# Patient Record
Sex: Female | Born: 1970 | Race: White | Hispanic: No | Marital: Single | Smoking: Never smoker
Health system: Southern US, Community
[De-identification: ages and names within clinical notes are randomized; demographics above are authoritative.]

## PROBLEM LIST (undated history)

## (undated) DIAGNOSIS — M7121 Synovial cyst of popliteal space [Baker], right knee: Secondary | ICD-10-CM

## (undated) DIAGNOSIS — M1990 Unspecified osteoarthritis, unspecified site: Secondary | ICD-10-CM

## (undated) DIAGNOSIS — F419 Anxiety disorder, unspecified: Secondary | ICD-10-CM

## (undated) DIAGNOSIS — G4700 Insomnia, unspecified: Secondary | ICD-10-CM

## (undated) DIAGNOSIS — J45909 Unspecified asthma, uncomplicated: Secondary | ICD-10-CM

## (undated) DIAGNOSIS — T753XXA Motion sickness, initial encounter: Secondary | ICD-10-CM

## (undated) HISTORY — PX: TUBAL LIGATION: SHX77

## (undated) HISTORY — PX: CARPAL TUNNEL RELEASE: SHX101

## (undated) HISTORY — PX: ANTERIOR CRUCIATE LIGAMENT REPAIR: SHX115

## (undated) HISTORY — DX: Synovial cyst of popliteal space (Baker), right knee: M71.21

## (undated) HISTORY — DX: Anxiety disorder, unspecified: F41.9

## (undated) HISTORY — PX: OTHER SURGICAL HISTORY: SHX169

---

## 2013-02-03 ENCOUNTER — Other Ambulatory Visit: Payer: Self-pay

## 2013-02-03 DIAGNOSIS — Z1231 Encounter for screening mammogram for malignant neoplasm of breast: Secondary | ICD-10-CM

## 2013-03-04 ENCOUNTER — Ambulatory Visit
Admission: RE | Admit: 2013-03-04 | Discharge: 2013-03-04 | Disposition: A | Discharge status: Home / Self Care | Payer: 59 | Source: Ambulatory Visit

## 2013-03-04 DIAGNOSIS — Z1231 Encounter for screening mammogram for malignant neoplasm of breast: Secondary | ICD-10-CM

## 2014-02-23 ENCOUNTER — Other Ambulatory Visit: Payer: Self-pay

## 2014-02-23 DIAGNOSIS — Z1231 Encounter for screening mammogram for malignant neoplasm of breast: Secondary | ICD-10-CM

## 2014-03-11 ENCOUNTER — Ambulatory Visit: Payer: 59

## 2014-05-13 ENCOUNTER — Ambulatory Visit
Admission: RE | Admit: 2014-05-13 | Discharge: 2014-05-13 | Disposition: A | Discharge status: Home / Self Care | Payer: 59 | Source: Ambulatory Visit

## 2014-05-13 ENCOUNTER — Other Ambulatory Visit: Payer: Self-pay

## 2014-05-13 DIAGNOSIS — Z1231 Encounter for screening mammogram for malignant neoplasm of breast: Secondary | ICD-10-CM

## 2015-04-05 ENCOUNTER — Other Ambulatory Visit: Payer: Self-pay

## 2015-04-05 DIAGNOSIS — Z1231 Encounter for screening mammogram for malignant neoplasm of breast: Secondary | ICD-10-CM

## 2015-04-29 ENCOUNTER — Ambulatory Visit
Admission: RE | Admit: 2015-04-29 | Discharge: 2015-04-29 | Disposition: A | Discharge status: Home / Self Care | Payer: PRIVATE HEALTH INSURANCE | Source: Ambulatory Visit

## 2015-04-29 DIAGNOSIS — Z1231 Encounter for screening mammogram for malignant neoplasm of breast: Secondary | ICD-10-CM

## 2017-04-20 ENCOUNTER — Other Ambulatory Visit: Payer: Self-pay | Admitting: Family Medicine

## 2017-04-20 DIAGNOSIS — Z1231 Encounter for screening mammogram for malignant neoplasm of breast: Secondary | ICD-10-CM

## 2017-05-09 ENCOUNTER — Ambulatory Visit
Admission: RE | Admit: 2017-05-09 | Discharge: 2017-05-09 | Disposition: A | Discharge status: Home / Self Care | Payer: PRIVATE HEALTH INSURANCE | Source: Ambulatory Visit | Attending: Family Medicine | Admitting: Family Medicine

## 2017-05-09 DIAGNOSIS — Z1231 Encounter for screening mammogram for malignant neoplasm of breast: Secondary | ICD-10-CM

## 2017-05-10 ENCOUNTER — Other Ambulatory Visit: Payer: Self-pay | Admitting: Family Medicine

## 2017-05-10 DIAGNOSIS — R928 Other abnormal and inconclusive findings on diagnostic imaging of breast: Secondary | ICD-10-CM

## 2017-05-15 ENCOUNTER — Ambulatory Visit: Payer: BLUE CROSS/BLUE SHIELD

## 2017-05-15 ENCOUNTER — Ambulatory Visit
Admission: RE | Admit: 2017-05-15 | Discharge: 2017-05-15 | Disposition: A | Discharge status: Home / Self Care | Payer: BLUE CROSS/BLUE SHIELD | Source: Ambulatory Visit | Attending: Family Medicine | Admitting: Family Medicine

## 2017-05-15 DIAGNOSIS — R928 Other abnormal and inconclusive findings on diagnostic imaging of breast: Secondary | ICD-10-CM

## 2017-06-26 DIAGNOSIS — D1801 Hemangioma of skin and subcutaneous tissue: Secondary | ICD-10-CM | POA: Diagnosis not present

## 2017-06-26 DIAGNOSIS — L920 Granuloma annulare: Secondary | ICD-10-CM | POA: Diagnosis not present

## 2017-06-26 DIAGNOSIS — D485 Neoplasm of uncertain behavior of skin: Secondary | ICD-10-CM | POA: Diagnosis not present

## 2017-06-26 DIAGNOSIS — L578 Other skin changes due to chronic exposure to nonionizing radiation: Secondary | ICD-10-CM | POA: Diagnosis not present

## 2017-06-26 DIAGNOSIS — Z85828 Personal history of other malignant neoplasm of skin: Secondary | ICD-10-CM | POA: Diagnosis not present

## 2017-12-12 DIAGNOSIS — Z0000 Encounter for general adult medical examination without abnormal findings: Secondary | ICD-10-CM | POA: Diagnosis not present

## 2017-12-12 DIAGNOSIS — F411 Generalized anxiety disorder: Secondary | ICD-10-CM | POA: Diagnosis not present

## 2017-12-12 DIAGNOSIS — F5101 Primary insomnia: Secondary | ICD-10-CM | POA: Diagnosis not present

## 2018-01-25 ENCOUNTER — Ambulatory Visit (INDEPENDENT_AMBULATORY_CARE_PROVIDER_SITE_OTHER): Payer: No Typology Code available for payment source

## 2018-01-25 ENCOUNTER — Encounter: Payer: Self-pay | Admitting: Sports Medicine

## 2018-01-25 ENCOUNTER — Ambulatory Visit (INDEPENDENT_AMBULATORY_CARE_PROVIDER_SITE_OTHER): Payer: No Typology Code available for payment source | Admitting: Sports Medicine

## 2018-01-25 DIAGNOSIS — M7121 Synovial cyst of popliteal space [Baker], right knee: Secondary | ICD-10-CM

## 2018-01-25 DIAGNOSIS — Z9889 Other specified postprocedural states: Secondary | ICD-10-CM

## 2018-01-25 DIAGNOSIS — M1711 Unilateral primary osteoarthritis, right knee: Secondary | ICD-10-CM | POA: Insufficient documentation

## 2018-01-25 DIAGNOSIS — M2341 Loose body in knee, right knee: Secondary | ICD-10-CM

## 2018-01-25 DIAGNOSIS — M25561 Pain in right knee: Secondary | ICD-10-CM

## 2018-01-25 DIAGNOSIS — G8929 Other chronic pain: Secondary | ICD-10-CM | POA: Diagnosis not present

## 2018-01-25 DIAGNOSIS — M25461 Effusion, right knee: Secondary | ICD-10-CM

## 2018-01-25 HISTORY — DX: Synovial cyst of popliteal space (Baker), right knee: M71.21

## 2018-01-25 NOTE — Progress Notes (Signed)
Subjective:    I'm seeing this patient as a consultation for: [place], PA-C  CC: Right knee pain  HPI: This is a pleasant 47 year old female, she has a history of an ACL reconstruction decades ago.  Since then she has had progressive swelling and discomfort along the medial joint line of her right knee, and more recently a palpable bulge that is tender on the posterior medial aspect of her right knee as well.  Her boyfriend is a Education officer, community, and they suspected she had a Baker's cyst.  Times are moderate, persistent, localized without radiation.  I reviewed the past medical history, family history, social history, surgical history, and allergies today and no changes were needed.  Please see the problem list section below in epic for further details.  Past Medical History: Past Medical History:  Diagnosis Date  . Anxiety    Past Surgical History: No past surgical history on file. Social History: Social History   Socioeconomic History  . Marital status: Married    Spouse name: Not on file  . Number of children: Not on file  . Years of education: Not on file  . Highest education level: Not on file  Occupational History  . Not on file  Social Needs  . Financial resource strain: Not on file  . Food insecurity:    Worry: Not on file    Inability: Not on file  . Transportation needs:    Medical: Not on file    Non-medical: Not on file  Tobacco Use  . Smoking status: Never Smoker  . Smokeless tobacco: Never Used  Substance and Sexual Activity  . Alcohol use: Yes  . Drug use: Never  . Sexual activity: Yes  Lifestyle  . Physical activity:    Days per week: Not on file    Minutes per session: Not on file  . Stress: Not on file  Relationships  . Social connections:    Talks on phone: Not on file    Gets together: Not on file    Attends religious service: Not on file    Active member of club or organization: Not on file    Attends meetings of clubs or  organizations: Not on file    Relationship status: Not on file  Other Topics Concern  . Not on file  Social History Narrative  . Not on file   Family History: Family History  Problem Relation Age of Onset  . Breast cancer Neg Hx    Allergies: Allergies  Allergen Reactions  . Penicillins Nausea Only   Medications: See med rec.  Review of Systems: No headache, visual changes, nausea, vomiting, diarrhea, constipation, dizziness, abdominal pain, skin rash, fevers, chills, night sweats, weight loss, swollen lymph nodes, body aches, joint swelling, muscle aches, chest pain, shortness of breath, mood changes, visual or auditory hallucinations.   Objective:   General: Well Developed, well nourished, and in no acute distress.  Neuro:  Extra-ocular muscles intact, able to move all 4 extremities, sensation grossly intact.  Deep tendon reflexes tested were normal. Psych: Alert and oriented, mood congruent with affect. ENT:  Ears and nose appear unremarkable.  Hearing grossly normal. Neck: Unremarkable overall appearance, trachea midline.  No visible thyroid enlargement. Eyes: Conjunctivae and lids appear unremarkable.  Pupils equal and round. Skin: Warm and dry, no rashes noted.  Cardiovascular: Pulses palpable, no extremity edema. Right knee: Minimally swollen visibly, palpable fluid wave. Tender to palpation along the medial joint line. Palpable Baker's cyst at the  crux between the gastrocnemius and the semimembranosus. Knee is unable to extend past 5 degrees. Ligaments with solid consistent endpoints including PCL, LCL, MCL. I am unable to appreciate her ACL, she has a positive Lachman sign. Negative Mcmurray's and provocative meniscal tests. Non painful patellar compression. Patellar and quadriceps tendons unremarkable. Hamstring and quadriceps strength is normal.  Procedure: Real-time Ultrasound Guided aspiration/injection of right knee Baker's cyst Device: GE Logiq E  Verbal  informed consent obtained.  Time-out conducted.  Noted no overlying erythema, induration, or other signs of local infection.  Skin prepped in a sterile fashion.  Local anesthesia: Topical Ethyl chloride.  With sterile technique and under real time ultrasound guidance: Using an 18-gauge needle advanced into the Baker's cyst, aspirated 5.5 mL of clear, straw-colored fluid, syringe switched and 1 cc Kenalog 40, 1 cc lidocaine injected easily. Completed without difficulty  Pain immediately resolved suggesting accurate placement of the medication.  Advised to call if fevers/chills, erythema, induration, drainage, or persistent bleeding.  Images permanently stored and available for review in the ultrasound unit.  Impression: Technically successful ultrasound guided injection.  The knee was then strapped with a compressive dressing.  Impression and Recommendations:   This case required medical decision making of moderate complexity.  S/P ACL reconstruction There is likely significant osteoarthritis, meniscal tearing and I am concerned that she has failure of the initial ACL graft. Adding x-rays and an MRI.  Baker's cyst, right Aspiration and injection. This is certainly secondary to internal derangement. ___________________________________________ Ihor Austin. Benjamin Stain, M.D., ABFM., CAQSM. Primary Care and Sports Medicine [facility]  Adjunct Instructor of Family Medicine  [employer]

## 2018-01-25 NOTE — Assessment & Plan Note (Signed)
There is likely significant osteoarthritis, meniscal tearing and I am concerned that she has failure of the initial ACL graft. Adding x-rays and an MRI.

## 2018-01-25 NOTE — Assessment & Plan Note (Signed)
Aspiration and injection. This is certainly secondary to internal derangement.

## 2018-02-21 ENCOUNTER — Encounter: Payer: Self-pay | Admitting: Family Medicine

## 2018-02-21 ENCOUNTER — Ambulatory Visit (INDEPENDENT_AMBULATORY_CARE_PROVIDER_SITE_OTHER): Payer: No Typology Code available for payment source | Admitting: Family Medicine

## 2018-02-21 VITALS — BP 138/82 | HR 75 | Wt 136.0 lb

## 2018-02-21 DIAGNOSIS — M7121 Synovial cyst of popliteal space [Baker], right knee: Secondary | ICD-10-CM

## 2018-02-21 DIAGNOSIS — M25561 Pain in right knee: Secondary | ICD-10-CM

## 2018-02-21 DIAGNOSIS — Z9889 Other specified postprocedural states: Secondary | ICD-10-CM

## 2018-02-21 DIAGNOSIS — G8929 Other chronic pain: Secondary | ICD-10-CM

## 2018-02-21 NOTE — Patient Instructions (Signed)
Thank you for coming in today.  Call or go to the ER if you develop a large red swollen joint with extreme pain or oozing puss.  Get MRI as scheduled.  Recheck with me or Dr T as needed.     Baker Cyst A Baker cyst, also called a popliteal cyst, is a sac-like growth that forms at the back of the knee. The cyst forms when the fluid-filled sac (bursa) that cushions the knee joint becomes enlarged. The bursa that becomes a Baker cyst is located at the back of the knee joint. What are the causes? In most cases, a Baker cyst results from another knee problem that causes swelling inside the knee. This makes the fluid inside the knee joint (synovial fluid) flow into the bursa behind the knee, causing the bursa to enlarge. What increases the risk? You may be more likely to develop a Baker cyst if you already have a knee problem, such as:  A tear in cartilage that cushions the knee joint (meniscal tear).  A tear in the tissues that connect the bones of the knee joint (ligament tear).  Knee swelling from osteoarthritis, rheumatoid arthritis, or gout.  What are the signs or symptoms? A Baker cyst does not always cause symptoms. A lump behind the knee may be the only sign of the condition. The lump may be painful, especially when the knee is straightened. If the lump is painful, the pain may come and go. The knee may also be stiff. Symptoms may quickly get more severe if the cyst breaks open (ruptures). If your cyst ruptures, signs and symptoms may affect the knee and the back of the lower leg (calf) and may include:  Sudden or worsening pain.  Swelling.  Bruising.  How is this diagnosed? This condition may be diagnosed based on your symptoms and medical history. Your health care provider will also do a physical exam. This may include:  Feeling the cyst to check whether it is tender.  Checking your knee for signs of another knee condition that causes swelling.  You may have imaging tests,  such as:  X-rays.  MRI.  Ultrasound.  How is this treated? A Baker cyst that is not painful may go away without treatment. If the cyst gets large or painful, it will likely get better if the underlying knee problem is treated. Treatment for a Baker cyst may include:  Resting.  Keeping weight off of the knee. This means not leaning on the knee to support your body weight.  NSAIDs to reduce pain and swelling.  A procedure to drain the fluid from the cyst with a needle (aspiration). You may also get an injection of a medicine that reduces swelling (steroid).  Surgery. This may be needed if other treatments do not work. This usually involves correcting knee damage and removing the cyst.  Follow these instructions at home:  Take over-the-counter and prescription medicines only as told by your health care provider.  Rest and return to your normal activities as told by your health care provider. Avoid activities that make pain or swelling worse. Ask your health care provider what activities are safe for you.  Keep all follow-up visits as told by your health care provider. This is important. Contact a health care provider if:  You have knee pain, stiffness, or swelling that does not get better. Get help right away if:  You have sudden or worsening pain and swelling in your calf area. This information is not intended to  replace advice given to you by your health care provider. Make sure you discuss any questions you have with your health care provider. Document Released: 04/03/2005 Document Revised: 12/23/2015 Document Reviewed: 12/23/2015 Elsevier Interactive Patient Education  2018 [employer]

## 2018-02-22 ENCOUNTER — Encounter: Payer: Self-pay | Admitting: Family Medicine

## 2018-02-22 NOTE — Progress Notes (Signed)
Vanessa Moss is a 47 y.o. female who presents to [facility] today for right knee pain and swelling.  Loryn was seen for this issue about a month ago with my partner Dr. Benjamin Stain.  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  Additionally x-ray showed loose bodies.  She had aspiration and injection of Baker's cyst and an MRI was ordered.  She delayed the MRI because she felt better but notes that the pain and swelling has returned.  She is scheduled the MRI and that is due for about 10 days from now.  She notes pain and swelling diffusely in the knee but mostly at the posterior medial knee.  She notes discomfort with flexion and extension and some limping.  The pain is interfering with her ability to walk normally and hike.  She finds this annoying and rates her symptoms as moderate.  She is tried over-the-counter medication and compression which have only helped a little.  She would be willing to consider surgery at this time.  She would like a repeat aspiration and injection if possible while she is waiting on her MRI.    ROS:  As above  Exam:  BP 138/82   Pulse 75   Wt 136 lb (61.7 kg)   LMP 01/25/2018   BMI 21.95 kg/m  General: Well Developed, well nourished, and in no acute distress.  Neuro/Psych: Alert and oriented x3, extra-ocular muscles intact, able to move all 4 extremities, sensation grossly intact. Skin: Warm and dry, no rashes noted.  Respiratory: Not using accessory muscles, speaking in full sentences, trachea midline.  Cardiovascular: Pulses palpable, no extremity edema. Abdomen: Does not appear distended. MSK:  Right knee well-appearing midline scar with no large deformity or erythema.  Mild effusion present. Range of motion full 0-120 degrees with crepitations. Tender to palpation posterior medial knee otherwise nontender. Slight laxity with  anterior drawer testing however patient guards with exam.  Negative laxity or pain with LCL or MCL stress testing. Intact flexion and extension strength.    Lab and Radiology Results Procedure: Real-time Ultrasound Guided aspiration and injection of right posterior knee Baker's cyst Device: GE Logiq E   Images permanently stored and available for review in the ultrasound unit. Verbal informed consent obtained.  Discussed risks and benefits of procedure. Warned about infection bleeding damage to structures skin hypopigmentation and fat atrophy among others. Patient expresses understanding and agreement Time-out conducted.   Noted no overlying erythema, induration, or other signs of local infection.   Skin prepped in a sterile fashion.   Local anesthesia: Topical Ethyl chloride.   With sterile technique and under real time ultrasound guidance:  2 mL of lidocaine subcutaneously and into this Baker's cyst injected to achieve good anesthesia.  The skin was again sterilized and 18-gauge needle was used to access the cyst and 5 mL of yellow clear fluid was aspirated.  Syringe was exchanged and 80 mg of Depo-Medrol and 3 mL of Marcaine were injected easily.   Completed without difficulty   Pain immediately resolved suggesting accurate placement of the medication.   Advised to call if fevers/chills, erythema, induration, drainage, or persistent bleeding.   Images permanently stored and available for review in the ultrasound unit.  Impression: Technically successful ultrasound guided injection.   EXAM: RIGHT KNEE - COMPLETE 4+ VIEW  COMPARISON:  None.  FINDINGS: Right ACL repair noted with  metallic screws. A small joint effusion is present. Loose bodies are noted within the joint. The joint is located. No acute fracture is present. Mild degenerative changes are present in the medial compartment.  IMPRESSION: 1. Small joint effusion and loose bodies may be related to degenerative  change. 2. No acute osseous abnormality.   Electronically Signed   By: Marin Roberts M.D.   On: 01/25/2018 16:08 I personally (independently) visualized and performed the interpretation of the images attached in this note.      Assessment and Plan: 47 y.o. female with  Right knee pain and swelling due to Baker's cyst.  Fundamental underlying cause of effusion is very likely degenerative change and loose bodies and possible meniscus injury and also probably in the setting of ACL deficient knee.  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  We discussed the less than stellar prognosis of continued aspiration of Baker's cyst but a repeat injection is reasonable in this time.  I think it is very important to proceed with knee MRI as this will show [place] more severe degenerative changes are present, position of loose bodies, integrity of meniscus, and integrity of ACL and other ligaments.  If symptoms return and the injuries amenable to surgical repair or debridement are present may be reasonable to proceed with surgery referral at follow-up with Dr. Benjamin Stain as scheduled on November 20..  In the meantime moderate activity as tolerated, use compression knee sleeve. and recheck sooner  if needed.     Historical information moved to improve visibility of documentation.  Past Medical History:  Diagnosis Date  . Anxiety   . Baker's cyst, right 01/25/2018   Past Surgical History:  Procedure Laterality Date  . ANTERIOR CRUCIATE LIGAMENT REPAIR Right    Social History   Tobacco Use  . Smoking status: Never Smoker  . Smokeless tobacco: Never Used  Substance Use Topics  . Alcohol use: Yes   family history is not on file.  Medications: Current Outpatient Medications  Medication Sig Dispense Refill  . buPROPion (WELLBUTRIN) 75 MG tablet Take by mouth.    . clonazePAM (KLONOPIN) 1  MG tablet TAKE ONE TABLET BY MOUTH TWO TIMES A DAY    . zolpidem (AMBIEN) 10 MG tablet TAKE ONE TABLET BY MOUTH EVERY NIGHT AT BEDTIME AS NEEDED FOR SLEEP     No current facility-administered medications for this visit.    Allergies  Allergen Reactions  . Penicillins Nausea Only      Discussed warning signs or symptoms. Please see discharge instructions. Patient expresses understanding.

## 2018-02-25 ENCOUNTER — Ambulatory Visit: Payer: No Typology Code available for payment source | Admitting: Sports Medicine

## 2018-03-04 ENCOUNTER — Ambulatory Visit (INDEPENDENT_AMBULATORY_CARE_PROVIDER_SITE_OTHER): Payer: No Typology Code available for payment source

## 2018-03-04 DIAGNOSIS — M1711 Unilateral primary osteoarthritis, right knee: Secondary | ICD-10-CM | POA: Diagnosis not present

## 2018-03-04 DIAGNOSIS — G8929 Other chronic pain: Secondary | ICD-10-CM

## 2018-03-04 DIAGNOSIS — M25561 Pain in right knee: Secondary | ICD-10-CM

## 2018-03-04 DIAGNOSIS — M7121 Synovial cyst of popliteal space [Baker], right knee: Secondary | ICD-10-CM

## 2018-03-04 DIAGNOSIS — Z9889 Other specified postprocedural states: Secondary | ICD-10-CM

## 2018-03-04 DIAGNOSIS — M25461 Effusion, right knee: Secondary | ICD-10-CM

## 2018-03-06 ENCOUNTER — Encounter: Payer: Self-pay | Admitting: Sports Medicine

## 2018-03-06 ENCOUNTER — Ambulatory Visit (INDEPENDENT_AMBULATORY_CARE_PROVIDER_SITE_OTHER): Payer: No Typology Code available for payment source | Admitting: Sports Medicine

## 2018-03-06 DIAGNOSIS — Z9889 Other specified postprocedural states: Secondary | ICD-10-CM

## 2018-03-06 DIAGNOSIS — M25561 Pain in right knee: Secondary | ICD-10-CM | POA: Diagnosis not present

## 2018-03-06 NOTE — Progress Notes (Signed)
Subjective:    CC: Follow-up  HPI: Vanessa LarssonJill returns, she is a pleasant 47 year old female rockclimbing, she has had 2 aspirations/injections of her Baker's cyst with recurrence of symptoms.  She is post ACL reconstruction, ACL graft was noted to be intact she was also noted to have tricompartmental osteoarthritis with free edge fraying of the meniscus, all as expected.  Continues to have pain, occasional catching and locking.  I reviewed the past medical history, family history, social history, surgical history, and allergies today and no changes were needed.  Please see the problem list section below in epic for further details.  Past Medical History: Past Medical History:  Diagnosis Date  . Anxiety   . Baker's cyst, right 01/25/2018   Past Surgical History: Past Surgical History:  Procedure Laterality Date  . ANTERIOR CRUCIATE LIGAMENT REPAIR Right    Social History: Social History   Socioeconomic History  . Marital status: Married    Spouse name: Not on file  . Number of children: Not on file  . Years of education: Not on file  . Highest education level: Not on file  Occupational History  . Not on file  Social Needs  . Financial resource strain: Not on file  . Food insecurity:    Worry: Not on file    Inability: Not on file  . Transportation needs:    Medical: Not on file    Non-medical: Not on file  Tobacco Use  . Smoking status: Never Smoker  . Smokeless tobacco: Never Used  Substance and Sexual Activity  . Alcohol use: Yes  . Drug use: Never  . Sexual activity: Yes  Lifestyle  . Physical activity:    Days per week: Not on file    Minutes per session: Not on file  . Stress: Not on file  Relationships  . Social connections:    Talks on phone: Not on file    Gets together: Not on file    Attends religious service: Not on file    Active member of club or organization: Not on file    Attends meetings of clubs or organizations: Not on file    Relationship  status: Not on file  Other Topics Concern  . Not on file  Social History Narrative  . Not on file   Family History: Family History  Problem Relation Age of Onset  . Breast cancer Neg Hx    Allergies: Allergies  Allergen Reactions  . Penicillins Nausea Only   Medications: See med rec.  Review of Systems: No fevers, chills, night sweats, weight loss, chest pain, or shortness of breath.   Objective:    General: Well Developed, well nourished, and in no acute distress.  Neuro: Alert and oriented x3, extra-ocular muscles intact, sensation grossly intact.  HEENT: Normocephalic, atraumatic, pupils equal round reactive to light, neck supple, no masses, no lymphadenopathy, thyroid nonpalpable.  Skin: Warm and dry, no rashes. Cardiac: Regular rate and rhythm, no murmurs rubs or gallops, no lower extremity edema.  Respiratory: Clear to auscultation bilaterally. Not using accessory muscles, speaking in full sentences.  Procedure: Real-time Ultrasound Guided Injection of right knee Device: GE Logiq E  Verbal informed consent obtained.  Time-out conducted.  Noted no overlying erythema, induration, or other signs of local infection.  Skin prepped in a sterile fashion.  Local anesthesia: Topical Ethyl chloride.  With sterile technique and under real time ultrasound guidance: Noted significant synovitis, 1 cc Kenalog 40, 2 cc lidocaine, 2 cc bupivacaine injected easily.  Completed without difficulty  Pain immediately resolved suggesting accurate placement of the medication.  Advised to call if fevers/chills, erythema, induration, drainage, or persistent bleeding.  Images permanently stored and available for review in the ultrasound unit.  Impression: Technically successful ultrasound guided injection.  Impression and Recommendations:    S/P ACL reconstruction History of ACL reconstruction, MRI shows some meniscal tearing, tricompartmental degenerative changes, intra-articular loose body  sitting in the suprapatellar recess. She does get occasional buckling and catching. I am going to inject her actual joint today, but I do think she needs a referral to Dr. Everardo Pacific for discussion of arthroscopic intervention. She is highly active, does a lot of rockclimbing so I do not think a knee replacement is reasonable at this time. My advice would be arthroscopic debridement of the structural pathology followed by Visco supplementation here at my office if still needed. No more steroid injections. ___________________________________________ Ihor Austin. Benjamin Stain, M.D., ABFM., CAQSM. Primary Care and Sports Medicine [facility]  Adjunct Professor of Family Medicine  [employer]

## 2018-03-06 NOTE — Assessment & Plan Note (Addendum)
History of ACL reconstruction, MRI shows some meniscal tearing, tricompartmental degenerative changes, intra-articular loose body sitting in the suprapatellar recess. She does get occasional buckling and catching. I am going to inject her actual joint today, but I do think she needs a referral to Dr. Everardo PacificVarkey for discussion of arthroscopic intervention. She is highly active, does a lot of rockclimbing so I do not think a knee replacement is reasonable at this time. My advice would be arthroscopic debridement of the structural pathology followed by Visco supplementation here at my office if still needed. No more steroid injections.

## 2018-04-01 ENCOUNTER — Other Ambulatory Visit: Payer: Self-pay | Admitting: Family Medicine

## 2018-04-01 DIAGNOSIS — Z1231 Encounter for screening mammogram for malignant neoplasm of breast: Secondary | ICD-10-CM

## 2018-04-02 ENCOUNTER — Other Ambulatory Visit: Payer: Self-pay | Admitting: Family Medicine

## 2018-04-02 DIAGNOSIS — N644 Mastodynia: Secondary | ICD-10-CM

## 2018-04-03 ENCOUNTER — Encounter: Payer: Self-pay | Admitting: Sports Medicine

## 2018-04-03 ENCOUNTER — Ambulatory Visit (INDEPENDENT_AMBULATORY_CARE_PROVIDER_SITE_OTHER): Payer: No Typology Code available for payment source | Admitting: Sports Medicine

## 2018-04-03 DIAGNOSIS — M7121 Synovial cyst of popliteal space [Baker], right knee: Secondary | ICD-10-CM | POA: Diagnosis not present

## 2018-04-03 MED ORDER — MELOXICAM 15 MG PO TABS: ORAL_TABLET | ORAL | 3 refills | Status: DC

## 2018-04-03 NOTE — Assessment & Plan Note (Signed)
No effusion or Baker's cyst today. She is scheduled for meniscal debridement with Dr. Everardo PacificVarkey. Adding meloxicam, she will ice her knee after climbing. Return as needed.

## 2018-04-03 NOTE — Progress Notes (Signed)
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 Everardo Pacific who plans arthroscopy with meniscal debridement sometime early next year.  She feels some fullness in the knee and is wondering if she has another Baker's cyst, and wondering if we can drain it before she goes on a rockclimbing trip.  I reviewed the past medical history, family history, social history, surgical history, and allergies today and no changes were needed.  Please see the problem list section below in epic for further details.  Past Medical History: Past Medical History:  Diagnosis Date  . Anxiety   . Baker's cyst, right 01/25/2018   Past Surgical History: Past Surgical History:  Procedure Laterality Date  . ANTERIOR CRUCIATE LIGAMENT REPAIR Right    Social History: Social History   Socioeconomic History  . Marital status: Married    Spouse name: Not on file  . Number of children: Not on file  . Years of education: Not on file  . Highest education level: Not on file  Occupational History  . Not on file  Social Needs  . Financial resource strain: Not on file  . Food insecurity:    Worry: Not on file    Inability: Not on file  . Transportation needs:    Medical: Not on file    Non-medical: Not on file  Tobacco Use  . Smoking status: Never Smoker  . Smokeless tobacco: Never Used  Substance and Sexual Activity  . Alcohol use: Yes  . Drug use: Never  . Sexual activity: Yes  Lifestyle  . Physical activity:    Days per week: Not on file    Minutes per session: Not on file  . Stress: Not on file  Relationships  . Social connections:    Talks on phone: Not on file    Gets together: Not on file    Attends religious service: Not on file    Active member of club or organization: Not on file    Attends meetings of clubs or organizations: Not on file   Relationship status: Not on file  Other Topics Concern  . Not on file  Social History Narrative  . Not on file   Family History: Family History  Problem Relation Age of Onset  . Breast cancer Neg Hx    Allergies: Allergies  Allergen Reactions  . Penicillins Nausea Only   Medications: See med rec.  Review of Systems: No fevers, chills, night sweats, weight loss, chest pain, or shortness of breath.   Objective:    General: Well Developed, well nourished, and in no acute distress.  Neuro: Alert and oriented x3, extra-ocular muscles intact, sensation grossly intact.  HEENT: Normocephalic, atraumatic, pupils equal round reactive to light, neck supple, no masses, no lymphadenopathy, thyroid nonpalpable.  Skin: Warm and dry, no rashes. Cardiac: Regular rate and rhythm, no murmurs rubs or gallops, no lower extremity edema.  Respiratory: Clear to auscultation bilaterally. Not using accessory muscles, speaking in full sentences. Right knee: Normal to inspection with no erythema or effusion or obvious bony abnormalities. Palpation normal with no warmth or joint line tenderness or patellar tenderness or condyle tenderness. ROM normal in flexion and extension and lower leg rotation. Ligaments with solid consistent endpoints including ACL, PCL, LCL, MCL. Negative Mcmurray's and provocative meniscal tests. Non painful patellar compression. Patellar and quadriceps tendons unremarkable. Hamstring and quadriceps strength  is normal.  Procedure: Diagnostic Ultrasound of right knee Device: GE Logiq E  Findings: No Baker's cyst, no knee joint effusion. Images permanently stored and available for review in the ultrasound unit.  Impression: Heterogeneity of the menisci consistent with meniscal fraying, minimal periarticular osteophytosis, no effusion, no Baker's cyst.  Impression and Recommendations:    Baker's cyst, right No effusion or Baker's cyst today. She is scheduled for meniscal  debridement with Dr. Everardo PacificVarkey. Adding meloxicam, she will ice her knee after climbing. Return as needed. ___________________________________________ Ihor Austinhomas J. Benjamin Stainhekkekandam, M.D., ABFM., CAQSM. Primary Care and Sports Medicine [facility]  Adjunct Professor of Family Medicine  [employer]

## 2018-04-23 ENCOUNTER — Other Ambulatory Visit: Payer: Self-pay

## 2018-04-23 ENCOUNTER — Encounter (HOSPITAL_BASED_OUTPATIENT_CLINIC_OR_DEPARTMENT_OTHER): Payer: Self-pay | Admitting: *Deleted

## 2018-05-02 ENCOUNTER — Encounter (HOSPITAL_BASED_OUTPATIENT_CLINIC_OR_DEPARTMENT_OTHER)
Admission: RE | Disposition: A | Discharge status: Home / Self Care | Payer: Self-pay | Source: Home / Self Care | Attending: Orthopaedic Surgery

## 2018-05-02 ENCOUNTER — Encounter (HOSPITAL_BASED_OUTPATIENT_CLINIC_OR_DEPARTMENT_OTHER): Payer: Self-pay | Admitting: *Deleted

## 2018-05-02 ENCOUNTER — Other Ambulatory Visit: Payer: Self-pay

## 2018-05-02 ENCOUNTER — Ambulatory Visit (HOSPITAL_BASED_OUTPATIENT_CLINIC_OR_DEPARTMENT_OTHER)
Admission: RE | Admit: 2018-05-02 | Discharge: 2018-05-02 | Disposition: A | Discharge status: Home / Self Care | Payer: No Typology Code available for payment source | Attending: Orthopaedic Surgery | Admitting: Orthopaedic Surgery

## 2018-05-02 ENCOUNTER — Ambulatory Visit (HOSPITAL_BASED_OUTPATIENT_CLINIC_OR_DEPARTMENT_OTHER): Payer: No Typology Code available for payment source | Admitting: Anesthesiology

## 2018-05-02 DIAGNOSIS — Z79899 Other long term (current) drug therapy: Secondary | ICD-10-CM | POA: Diagnosis not present

## 2018-05-02 DIAGNOSIS — M23203 Derangement of unspecified medial meniscus due to old tear or injury, right knee: Secondary | ICD-10-CM | POA: Diagnosis present

## 2018-05-02 DIAGNOSIS — Z791 Long term (current) use of non-steroidal anti-inflammatories (NSAID): Secondary | ICD-10-CM | POA: Diagnosis not present

## 2018-05-02 DIAGNOSIS — M23200 Derangement of unspecified lateral meniscus due to old tear or injury, right knee: Secondary | ICD-10-CM | POA: Insufficient documentation

## 2018-05-02 DIAGNOSIS — M1711 Unilateral primary osteoarthritis, right knee: Secondary | ICD-10-CM | POA: Insufficient documentation

## 2018-05-02 DIAGNOSIS — F419 Anxiety disorder, unspecified: Secondary | ICD-10-CM | POA: Diagnosis not present

## 2018-05-02 HISTORY — PX: CHONDROPLASTY: SHX5177

## 2018-05-02 HISTORY — DX: Unspecified osteoarthritis, unspecified site: M19.90

## 2018-05-02 HISTORY — DX: Unspecified asthma, uncomplicated: J45.909

## 2018-05-02 HISTORY — PX: KNEE ARTHROSCOPY WITH LATERAL MENISECTOMY: SHX6193

## 2018-05-02 HISTORY — PX: KNEE ARTHROSCOPY WITH MEDIAL MENISECTOMY: SHX5651

## 2018-05-02 SURGERY — ARTHROSCOPY, KNEE, WITH LATERAL MENISCECTOMY
Anesthesia: General | Site: Knee | Laterality: Right

## 2018-05-02 MED ORDER — SCOPOLAMINE 1 MG/3DAYS TD PT72: 1.0000 | MEDICATED_PATCH | Freq: Once | TRANSDERMAL | Status: DC | PRN

## 2018-05-02 MED ORDER — MEPERIDINE HCL 25 MG/ML IJ SOLN: 6.2500 mg | INTRAMUSCULAR | Status: DC | PRN

## 2018-05-02 MED ORDER — OXYCODONE HCL 5 MG/5ML PO SOLN: 5.0000 mg | Freq: Once | ORAL | Status: DC | PRN

## 2018-05-02 MED ORDER — DEXAMETHASONE SODIUM PHOSPHATE 10 MG/ML IJ SOLN
INTRAMUSCULAR | Status: AC
  Filled 2018-05-02: qty 1

## 2018-05-02 MED ORDER — ONDANSETRON HCL 4 MG/2ML IJ SOLN: 4.0000 mg | Freq: Once | INTRAMUSCULAR | Status: DC | PRN

## 2018-05-02 MED ORDER — ONDANSETRON HCL 4 MG/2ML IJ SOLN
INTRAMUSCULAR | Status: AC
  Filled 2018-05-02: qty 2

## 2018-05-02 MED ORDER — LIDOCAINE 2% (20 MG/ML) 5 ML SYRINGE
INTRAMUSCULAR | Status: AC
  Filled 2018-05-02: qty 5

## 2018-05-02 MED ORDER — MIDAZOLAM HCL 2 MG/2ML IJ SOLN
1.0000 mg | INTRAMUSCULAR | Status: DC | PRN
  Administered 2018-05-02: 2 mg via INTRAVENOUS

## 2018-05-02 MED ORDER — OXYCODONE HCL 5 MG PO TABS: 5.0000 mg | ORAL_TABLET | Freq: Once | ORAL | Status: DC | PRN

## 2018-05-02 MED ORDER — KETOROLAC TROMETHAMINE 30 MG/ML IJ SOLN
INTRAMUSCULAR | Status: DC | PRN
  Administered 2018-05-02: 30 mg via INTRAVENOUS

## 2018-05-02 MED ORDER — FENTANYL CITRATE (PF) 100 MCG/2ML IJ SOLN
50.0000 ug | INTRAMUSCULAR | Status: DC | PRN
  Administered 2018-05-02: 100 ug via INTRAVENOUS

## 2018-05-02 MED ORDER — LACTATED RINGERS IV SOLN
INTRAVENOUS | Status: DC
  Administered 2018-05-02: 08:00:00 via INTRAVENOUS

## 2018-05-02 MED ORDER — ACETAMINOPHEN 325 MG PO TABS: 325.0000 mg | ORAL_TABLET | ORAL | Status: DC | PRN

## 2018-05-02 MED ORDER — LIDOCAINE 2% (20 MG/ML) 5 ML SYRINGE
INTRAMUSCULAR | Status: DC | PRN
  Administered 2018-05-02: 60 mg via INTRAVENOUS

## 2018-05-02 MED ORDER — CEFAZOLIN SODIUM-DEXTROSE 2-4 GM/100ML-% IV SOLN
INTRAVENOUS | Status: AC
  Filled 2018-05-02: qty 100

## 2018-05-02 MED ORDER — OXYCODONE HCL 5 MG PO TABS: ORAL_TABLET | ORAL | 0 refills | Status: AC

## 2018-05-02 MED ORDER — OXYCODONE HCL 5 MG/5ML PO SOLN: 5.0000 mg | Freq: Once | ORAL | Status: AC | PRN

## 2018-05-02 MED ORDER — FENTANYL CITRATE (PF) 100 MCG/2ML IJ SOLN
INTRAMUSCULAR | Status: AC
  Filled 2018-05-02: qty 2

## 2018-05-02 MED ORDER — ACETAMINOPHEN 160 MG/5ML PO SOLN: 325.0000 mg | ORAL | Status: DC | PRN

## 2018-05-02 MED ORDER — DEXAMETHASONE SODIUM PHOSPHATE 4 MG/ML IJ SOLN
INTRAMUSCULAR | Status: DC | PRN
  Administered 2018-05-02: 10 mg via INTRAVENOUS

## 2018-05-02 MED ORDER — OXYCODONE HCL 5 MG PO TABS
ORAL_TABLET | ORAL | Status: AC
  Filled 2018-05-02: qty 1

## 2018-05-02 MED ORDER — ACETAMINOPHEN 500 MG PO TABS: 1000.0000 mg | ORAL_TABLET | Freq: Three times a day (TID) | ORAL | 0 refills | Status: AC

## 2018-05-02 MED ORDER — ASPIRIN 81 MG PO TABS: 81.0000 mg | ORAL_TABLET | Freq: Every day | ORAL | 0 refills | Status: AC

## 2018-05-02 MED ORDER — HYDROMORPHONE HCL 1 MG/ML IJ SOLN
INTRAMUSCULAR | Status: AC
  Filled 2018-05-02: qty 0.5

## 2018-05-02 MED ORDER — OXYCODONE HCL 5 MG PO TABS
5.0000 mg | ORAL_TABLET | Freq: Once | ORAL | Status: AC | PRN
  Administered 2018-05-02: 5 mg via ORAL

## 2018-05-02 MED ORDER — PROMETHAZINE HCL 25 MG/ML IJ SOLN: 6.2500 mg | INTRAMUSCULAR | Status: DC | PRN

## 2018-05-02 MED ORDER — KETOROLAC TROMETHAMINE 30 MG/ML IJ SOLN
INTRAMUSCULAR | Status: AC
  Filled 2018-05-02: qty 1

## 2018-05-02 MED ORDER — CEFAZOLIN SODIUM-DEXTROSE 2-4 GM/100ML-% IV SOLN
2.0000 g | INTRAVENOUS | Status: AC
  Administered 2018-05-02: 2 g via INTRAVENOUS

## 2018-05-02 MED ORDER — FENTANYL CITRATE (PF) 100 MCG/2ML IJ SOLN: 25.0000 ug | INTRAMUSCULAR | Status: DC | PRN

## 2018-05-02 MED ORDER — ONDANSETRON HCL 4 MG PO TABS: 4.0000 mg | ORAL_TABLET | Freq: Three times a day (TID) | ORAL | 1 refills | Status: AC | PRN

## 2018-05-02 MED ORDER — PROPOFOL 10 MG/ML IV BOLUS
INTRAVENOUS | Status: DC | PRN
  Administered 2018-05-02: 200 mg via INTRAVENOUS

## 2018-05-02 MED ORDER — SODIUM CHLORIDE 0.9 % IR SOLN
Status: DC | PRN
  Administered 2018-05-02: 2 mL

## 2018-05-02 MED ORDER — CHLORHEXIDINE GLUCONATE 4 % EX LIQD: 60.0000 mL | Freq: Once | CUTANEOUS | Status: DC

## 2018-05-02 MED ORDER — HYDROMORPHONE HCL 1 MG/ML IJ SOLN
0.2500 mg | INTRAMUSCULAR | Status: DC | PRN
  Administered 2018-05-02 (×2): 0.5 mg via INTRAVENOUS

## 2018-05-02 MED ORDER — SODIUM CHLORIDE 0.9 % IR SOLN
Status: DC | PRN
  Administered 2018-05-02: 6000 mL

## 2018-05-02 MED ORDER — MIDAZOLAM HCL 2 MG/2ML IJ SOLN
INTRAMUSCULAR | Status: AC
  Filled 2018-05-02: qty 2

## 2018-05-02 SURGICAL SUPPLY — 35 items
BANDAGE ACE 6X5 VEL STRL LF (GAUZE/BANDAGES/DRESSINGS) ×1 IMPLANT
BANDAGE ESMARK 6X9 LF (GAUZE/BANDAGES/DRESSINGS) IMPLANT
BENZOIN TINCTURE PRP APPL 2/3 (GAUZE/BANDAGES/DRESSINGS) ×4 IMPLANT
BLADE CLIPPER SURG (BLADE) IMPLANT
BNDG ESMARK 6X9 LF (GAUZE/BANDAGES/DRESSINGS)
CHLORAPREP W/TINT 26ML (MISCELLANEOUS) ×4 IMPLANT
CUFF TOURNIQUET SINGLE 34IN LL (TOURNIQUET CUFF) ×1 IMPLANT
DISSECTOR 3.5MM X 13CM CVD (MISCELLANEOUS) ×1 IMPLANT
DISSECTOR 4.0MMX13CM CVD (MISCELLANEOUS) ×1 IMPLANT
DRAPE ARTHROSCOPY W/POUCH 90 (DRAPES) ×4 IMPLANT
DRAPE IMP U-DRAPE 54X76 (DRAPES) ×4 IMPLANT
DRAPE INCISE IOBAN 66X45 STRL (DRAPES) ×1 IMPLANT
DRAPE U-SHAPE 47X51 STRL (DRAPES) ×4 IMPLANT
GAUZE SPONGE 4X4 12PLY STRL (GAUZE/BANDAGES/DRESSINGS) IMPLANT
GLOVE BIOGEL PI IND STRL 8 (GLOVE) ×3 IMPLANT
GLOVE BIOGEL PI INDICATOR 8 (GLOVE) ×1
GLOVE ECLIPSE 8.0 STRL XLNG CF (GLOVE) ×8 IMPLANT
GOWN STRL REUS W/ TWL LRG LVL3 (GOWN DISPOSABLE) ×3 IMPLANT
GOWN STRL REUS W/TWL LRG LVL3 (GOWN DISPOSABLE) ×2
GOWN STRL REUS W/TWL XL LVL3 (GOWN DISPOSABLE) ×4 IMPLANT
KIT TURNOVER KIT B (KITS) ×4 IMPLANT
MANIFOLD NEPTUNE II (INSTRUMENTS) ×1 IMPLANT
NDL SAFETY ECLIPSE 18X1.5 (NEEDLE) ×3 IMPLANT
NEEDLE HYPO 18GX1.5 SHARP (NEEDLE) ×1
NS IRRIG 1000ML POUR BTL (IV SOLUTION) IMPLANT
PACK ARTHROSCOPY DSU (CUSTOM PROCEDURE TRAY) ×4 IMPLANT
PROBE BIPOLAR ATHRO 135MM 90D (MISCELLANEOUS) IMPLANT
SLEEVE SCD COMPRESS KNEE MED (MISCELLANEOUS) ×4 IMPLANT
STRIP CLOSURE SKIN 1/2X4 (GAUZE/BANDAGES/DRESSINGS) ×4 IMPLANT
SUT MNCRL AB 4-0 PS2 18 (SUTURE) ×4 IMPLANT
SYR 5ML LUER SLIP (SYRINGE) ×4 IMPLANT
TOWEL GREEN STERILE FF (TOWEL DISPOSABLE) ×4 IMPLANT
TUBE CONNECTING 20X1/4 (TUBING) ×4 IMPLANT
TUBING ARTHROSCOPY IRRIG 16FT (MISCELLANEOUS) ×4 IMPLANT
WATER STERILE IRR 1000ML POUR (IV SOLUTION) ×4 IMPLANT

## 2018-05-02 NOTE — Anesthesia Preprocedure Evaluation (Signed)
Anesthesia Evaluation  Patient identified by MRN, date of birth, ID band Patient awake    Reviewed: Allergy & Precautions, NPO status , Patient's Chart, lab work & pertinent test results  Airway Mallampati: II  TM Distance: >3 FB Neck ROM: Full    Dental no notable dental hx.    Pulmonary    Pulmonary exam normal breath sounds clear to auscultation       Cardiovascular negative cardio ROS Normal cardiovascular exam Rhythm:Regular Rate:Normal     Neuro/Psych Anxiety negative neurological ROS  negative psych ROS   GI/Hepatic negative GI ROS, Neg liver ROS,   Endo/Other  negative endocrine ROS  Renal/GU negative Renal ROS  negative genitourinary   Musculoskeletal  (+) Arthritis , Osteoarthritis,    Abdominal   Peds negative pediatric ROS (+)  Hematology negative hematology ROS (+)   Anesthesia Other Findings   Reproductive/Obstetrics negative OB ROS                             Anesthesia Physical Anesthesia Plan  ASA: II  Anesthesia Plan: General   Post-op Pain Management:    Induction: Intravenous  PONV Risk Score and Plan: 3 and Ondansetron, Dexamethasone and Midazolam  Airway Management Planned: LMA  Additional Equipment:   Intra-op Plan:   Post-operative Plan: Extubation in OR  Informed Consent: I have reviewed the patients History and Physical, chart, labs and discussed the procedure including the risks, benefits and alternatives for the proposed anesthesia with the patient or authorized representative who has indicated his/her understanding and acceptance.     Dental advisory given  Plan Discussed with: CRNA  Anesthesia Plan Comments:         Anesthesia Quick Evaluation

## 2018-05-02 NOTE — Transfer of Care (Signed)
Immediate Anesthesia Transfer of Care Note  Patient: Vanessa Moss  Procedure(s) Performed: RIGHT KNEE ARTHROSCOPY WITH MEDIAL MENISECTOMY (Right Knee) RIGHT KNEE CHONDROPLASTY (Right Knee)  Patient Location: PACU  Anesthesia Type:General  Level of Consciousness: awake and sedated  Airway & Oxygen Therapy: Patient Spontanous Breathing and Patient connected to face mask oxygen  Post-op Assessment: Report given to RN and Post -op Vital signs reviewed and stable  Post vital signs: Reviewed and stable  Last Vitals:  Vitals Value Taken Time  BP    Temp    Pulse    Resp    SpO2      Last Pain:  Vitals:   05/02/18 0750  TempSrc: Oral  PainSc: 0-No pain      Patients Stated Pain Goal: 0 (05/02/18 0750)  Complications: No apparent anesthesia complications

## 2018-05-02 NOTE — H&P (Signed)
PREOPERATIVE H&P  Chief Complaint: RIGHT KNEE LOOSE BODY, MEDIAL AND LATERAL MENISCAL TEARS  HPI: Vanessa Moss is a 48 y.o. female who presents for preoperative history and physical with a diagnosis of RIGHT KNEE LOOSE BODY, MEDIAL AND LATERAL MENISCAL TEARS. Symptoms are rated as moderate to severe, and have been worsening.  This is significantly impairing activities of daily living.  Please see my clinic note for full details on this patient's care.  She has elected for surgical management.   Past Medical History:  Diagnosis Date  . Anxiety   . Arthritis   . Asthma    allergy induced none in 3-4 years  . Baker's cyst, right 01/25/2018   Past Surgical History:  Procedure Laterality Date  . ANTERIOR CRUCIATE LIGAMENT REPAIR Right   . CARPAL TUNNEL RELEASE Right   . TUBAL LIGATION    . tubal ligation reversal     Social History   Socioeconomic History  . Marital status: Single    Spouse name: Not on file  . Number of children: Not on file  . Years of education: Not on file  . Highest education level: Not on file  Occupational History  . Not on file  Social Needs  . Financial resource strain: Not on file  . Food insecurity:    Worry: Not on file    Inability: Not on file  . Transportation needs:    Medical: Not on file    Non-medical: Not on file  Tobacco Use  . Smoking status: Never Smoker  . Smokeless tobacco: Never Used  Substance and Sexual Activity  . Alcohol use: Yes    Comment: occas  . Drug use: Never  . Sexual activity: Yes    Birth control/protection: Surgical    Comment: partner has had vasectomy   Lifestyle  . Physical activity:    Days per week: Not on file    Minutes per session: Not on file  . Stress: Not on file  Relationships  . Social connections:    Talks on phone: Not on file    Gets together: Not on file    Attends religious service: Not on file    Active member of club or organization: Not on file    Attends meetings of clubs or  organizations: Not on file    Relationship status: Not on file  Other Topics Concern  . Not on file  Social History Narrative  . Not on file   Family History  Problem Relation Age of Onset  . Breast cancer Neg Hx    Allergies  Allergen Reactions  . Penicillins Nausea Only   Prior to Admission medications   Medication Sig Start Date End Date Taking? Authorizing Provider  bu[employer]) 75 MG tablet Take by mouth. 12/12/17 12/12/18 Yes [provider]  clonazePAM (KLONOPIN) 1 MG tablet TAKE ONE TABLET BY MOUTH TWO TIMES A DAY 02/29/12  Yes [provider]  meloxicam (MOBIC) 15 MG tablet One tab PO qAM with breakfast for 2 weeks, then daily prn pain. 04/03/18  Yes Monica Becton, MD  zolpidem (AMBIEN) 10 MG tablet TAKE ONE TABLET BY MOUTH EVERY NIGHT AT BEDTIME AS NEEDED FOR SLEEP 12/14/16  Yes [provider]     Positive ROS: All other systems have been reviewed and were otherwise negative with the exception of those mentioned in the HPI and as above.  Physical Exam: General: Alert, no acute distress Cardiovascular: No pedal edema Respiratory: No cyanosis, no use of  accessory musculature GI: No organomegaly, abdomen is soft and non-tender Skin: No lesions in the area of chief complaint Neurologic: Sensation intact distally Psychiatric: Patient is competent for consent with normal mood and affect Lymphatic: No axillary or cervical lymphadenopathy  MUSCULOSKELETAL: RLE: Endpoint on Lockman however there is a few millimeter translation, no effusion, small eschar anterolateral aspect of the proximal tibia but nowhere near the incisions for the knee.  Assessment: RIGHT KNEE LOOSE BODY, MEDIAL AND LATERAL MENISCAL TEARS  Plan: Plan for Procedure(s): RIGHT KNEE ARTHROSCOPY WITH MEDIAL AND LATERAL MENISECTOMY RIGHT KNEE REMOVAL FOREIGN BODY EXTREMITY RIGHT KNEE CHONDROPLASTY  The risks benefits and alternatives were discussed with the  patient including but not limited to the risks of nonoperative treatment, versus surgical intervention including infection, bleeding, nerve injury,  blood clots, cardiopulmonary complications, morbidity, mortality, among others, and they were willing to proceed.   Pacifically discussed that some of her symptoms may be coming from arthritis and others may be coming from slight instability from her ACL reconstruction and possibly vertical graft.  Her primary complaint appears to be mostly loose body mechanical in nature and we feel that a staged procedure including a exam under anesthesia and removal of loose body with partial meniscectomy and chondroplasty would be appropriate.  If this does not help her symptoms she may require an ACL reconstruction however we do not think this to be necessary.  Bjorn Pippin, MD  05/02/2018 9:30 AM

## 2018-05-02 NOTE — Op Note (Signed)
Orthopaedic Surgery Operative Note (CSN: [id number])  Vanessa Moss  05-21-1970 Date of Surgery: 05/02/2018   Diagnoses:  Right knee pain, medial lateral meniscal tears and arthritis Procedure: Right chondroplasty of the knee arthroscopic Right medial lateral partial meniscectomies   Operative Finding Successful completion of planned procedure.  Patient had significant areas on the medial condyle as well as the medial tibia of bipolar grade 3 and 4 changes.  She had sequelae of previous partial meniscectomy medially and degenerative changes and fraying of the meniscus again.  This was debrided back with a shaver and a biting device.  Her ACL felt loose on exam with 5 mm translation but no spin.  Through the scope ACL took a small amount of load but was minimally functional in regards to intact fibers.  Lateral side of the joint had a degenerative horizontal meniscus tear that was debrided back to a stable base with a shaver and a biter.  40% of lateral meniscus total volume taken, 20% of medial.  There is significant grade 4 changes in the trochlea and grade 3 on the patella.  If the patient needs another surgery she should strongly consider total knee arthroplasty, smaller consideration for unicompartmental arthroplasty based on the patient's high level of function however we would leave this to 1 of our colleagues to determine if her ACL was functional enough to support this.   Post-operative plan: The patient will be weight bearing as tolerated.  The patient will be discharged home.  DVT prophylaxis aspirin 81 mg a day.  Pain control with PRN pain medication preferring oral medicines.  Follow up plan will be scheduled in approximately 7 days for incision check and likely starting physical therapy for quad strengthening and range of motion.  Post-Op Diagnosis: Same Surgeons:Primary: Bjorn Pippin, MD Assistants: None Location: [facility] OR ROOM 6 Anesthesia: Choice Antibiotics: Ancef 2g  preop Tourniquet time: None Estimated Blood Loss: Minimal Complications: None Specimens: None Implants: * No implants in log *  Indications for Surgery:   Vanessa Moss is a 48 y.o. female with loose bodies noted on MRI some mild chondrosis tricompartmental and intact ACL graft and mechanical symptoms.  She felt that her mechanical symptoms were primary complaint though she did sometimes have a hard time picking apart mechanical symptoms versus instability.  She had a solid endpoint on Lockman and intact graft on MRI and we felt that assessing her loose bodies and removing these, performing chondroplasty and partial meniscectomy would be appropriate.  If she failed this she understood that she may end up requiring a ACL reconstruction as there was a few millimeters of translation that would not be normally expected with a modern ACL and she may also be rotating over a vertical graft.  Benefits and risks of operative and nonoperative management were discussed prior to surgery with patient/guardian(s) and informed consent form was completed.  Specific risks including infection, need for additional surgery, stiffness, continued pain, need for further surgery.   Procedure:   The patient was identified properly. Informed consent was obtained and the surgical site was marked. The patient was taken up to suite where general anesthesia was induced. The patient was placed in the supine position with a post against the surgical leg and a nonsterile tourniquet applied. The surgical leg was then prepped and draped usual sterile fashion.  A standard surgical timeout was performed.  2 standard anterior portals were made and diagnostic arthroscopy performed. Please note the findings as noted above.  We used a  shaver and arthroscopic basket to perform meniscectomies medially and laterally as listed above.  This was taken back to a stable base.  Chondroplasty was performed gently of the patellofemoral joint as well as the  medial tibia and femur.  We found no obvious loose bodies and the MRI demonstrated loose body was not in the suprapatellar pouch and must of been buried within the tissues.  We cleared the gutters as well.  Incisions closed with absorbable suture. The patient was awoken from general anesthesia and taken to the PACU in stable condition without complication.

## 2018-05-02 NOTE — Anesthesia Procedure Notes (Signed)
Procedure Name: LMA Insertion Performed by: Alain Deschene W, CRNA Pre-anesthesia Checklist: Patient identified, Emergency Drugs available, Suction available and Patient being monitored Patient Re-evaluated:Patient Re-evaluated prior to induction Oxygen Delivery Method: Circle system utilized Preoxygenation: Pre-oxygenation with 100% oxygen Induction Type: IV induction Ventilation: Mask ventilation without difficulty LMA: LMA inserted LMA Size: 4.0 Number of attempts: 1 Placement Confirmation: positive ETCO2 Tube secured with: Tape Dental Injury: Teeth and Oropharynx as per pre-operative assessment        

## 2018-05-02 NOTE — Discharge Instructions (Signed)

## 2018-05-03 ENCOUNTER — Encounter (HOSPITAL_BASED_OUTPATIENT_CLINIC_OR_DEPARTMENT_OTHER): Payer: Self-pay | Admitting: Orthopaedic Surgery

## 2018-05-03 NOTE — Anesthesia Postprocedure Evaluation (Signed)
Anesthesia Post Note  Patient: Vanessa Moss  Procedure(s) Performed: RIGHT KNEE ARTHROSCOPY WITH MEDIAL MENISECTOMY (Right Knee) RIGHT KNEE CHONDROPLASTY (Right Knee) KNEE ARTHROSCOPY WITH MEDIAL MENISECTOMY (Knee)     Patient location during evaluation: PACU Anesthesia Type: General Level of consciousness: awake and alert Pain management: pain level controlled Vital Signs Assessment: post-procedure vital signs reviewed and stable Respiratory status: spontaneous breathing, nonlabored ventilation and respiratory function stable Cardiovascular status: blood pressure returned to baseline and stable Postop Assessment: no apparent nausea or vomiting Anesthetic complications: no    Last Vitals:  Vitals:   05/02/18 1130 05/02/18 1216  BP:  (!) 120/55  Pulse: 65 60  Resp: 17 16  Temp:  36.4 C  SpO2: 100% 98%    Last Pain:  Vitals:   05/02/18 1216  TempSrc: Oral  PainSc: 4    Pain Goal: Patients Stated Pain Goal: 4 (05/02/18 1216)                 Lowella Curb

## 2018-05-14 ENCOUNTER — Ambulatory Visit
Admission: RE | Admit: 2018-05-14 | Discharge: 2018-05-14 | Disposition: A | Discharge status: Home / Self Care | Payer: No Typology Code available for payment source | Source: Ambulatory Visit | Attending: Family Medicine | Admitting: Family Medicine

## 2018-05-14 ENCOUNTER — Ambulatory Visit: Payer: No Typology Code available for payment source

## 2018-05-14 DIAGNOSIS — N644 Mastodynia: Secondary | ICD-10-CM

## 2018-06-13 ENCOUNTER — Encounter: Payer: Self-pay | Admitting: Sports Medicine

## 2018-06-13 ENCOUNTER — Ambulatory Visit (INDEPENDENT_AMBULATORY_CARE_PROVIDER_SITE_OTHER): Payer: No Typology Code available for payment source | Admitting: Sports Medicine

## 2018-06-13 DIAGNOSIS — M7121 Synovial cyst of popliteal space [Baker], right knee: Secondary | ICD-10-CM

## 2018-06-13 NOTE — Progress Notes (Signed)
Subjective:    CC: Right knee swelling  HPI: This is a pleasant 48 year old female, she is status post right knee arthroscopy by Dr. Ramond Marrow, he did a partial meniscectomy, chondroplasty.  She has unfortunately developed persistence of her Baker's cyst.  She desires arthrocentesis today, symptoms are moderate, persistent, localized without radiation.  Surgery was only about 5 weeks ago.  I reviewed the past medical history, family history, social history, surgical history, and allergies today and no changes were needed.  Please see the problem list section below in epic for further details.  Past Medical History: Past Medical History:  Diagnosis Date  . Anxiety   . Arthritis   . Asthma    allergy induced none in 3-4 years  . Baker's cyst, right 01/25/2018   Past Surgical History: Past Surgical History:  Procedure Laterality Date  . ANTERIOR CRUCIATE LIGAMENT REPAIR Right   . CARPAL TUNNEL RELEASE Right   . CHONDROPLASTY Right 05/02/2018   Procedure: RIGHT KNEE CHONDROPLASTY;  Surgeon: Bjorn Pippin, MD;  Location: [facility];  Service: Orthopedics;  Laterality: Right;  . KNEE ARTHROSCOPY WITH LATERAL MENISECTOMY Right 05/02/2018   Procedure: RIGHT KNEE ARTHROSCOPY WITH MEDIAL MENISECTOMY;  Surgeon: Bjorn Pippin, MD;  Location: [facility];  Service: Orthopedics;  Laterality: Right;  . KNEE ARTHROSCOPY WITH MEDIAL MENISECTOMY  05/02/2018   Procedure: KNEE ARTHROSCOPY WITH MEDIAL MENISECTOMY;  Surgeon: Bjorn Pippin, MD;  Location: [facility];  Service: Orthopedics;;  . TUBAL LIGATION    . tubal ligation reversal     Social History: Social History   Socioeconomic History  . Marital status: Single    Spouse name: Not on file  . Number of children: Not on file  . Years of education: Not on file  . Highest education level: Not on file  Occupational History  . Not on file  Social Needs  . Financial resource strain: Not on file  .  Food insecurity:    Worry: Not on file    Inability: Not on file  . Transportation needs:    Medical: Not on file    Non-medical: Not on file  Tobacco Use  . Smoking status: Never Smoker  . Smokeless tobacco: Never Used  Substance and Sexual Activity  . Alcohol use: Yes    Comment: occas  . Drug use: Never  . Sexual activity: Yes    Birth control/protection: Surgical    Comment: partner has had vasectomy   Lifestyle  . Physical activity:    Days per week: Not on file    Minutes per session: Not on file  . Stress: Not on file  Relationships  . Social connections:    Talks on phone: Not on file    Gets together: Not on file    Attends religious service: Not on file    Active member of club or organization: Not on file    Attends meetings of clubs or organizations: Not on file    Relationship status: Not on file  Other Topics Concern  . Not on file  Social History Narrative  . Not on file   Family History: Family History  Problem Relation Age of Onset  . Breast cancer Neg Hx    Allergies: Allergies  Allergen Reactions  . Penicillins Nausea Only    @ Gms Ancef IV with no obvious reaction   Medications: See med rec.  Review of Systems: No fevers, chills, night sweats, weight loss, chest  pain, or shortness of breath.   Objective:    General: Well Developed, well nourished, and in no acute distress.  Neuro: Alert and oriented x3, extra-ocular muscles intact, sensation grossly intact.  HEENT: Normocephalic, atraumatic, pupils equal round reactive to light, neck supple, no masses, no lymphadenopathy, thyroid nonpalpable.  Skin: Warm and dry, no rashes. Cardiac: Regular rate and rhythm, no murmurs rubs or gallops, no lower extremity edema.  Respiratory: Clear to auscultation bilaterally. Not using accessory muscles, speaking in full sentences. Right knee: Visible, palpable tense tender Baker's cyst at the crux of the gastrocnemius and the semimembranosus. ROM normal  in flexion and extension and lower leg rotation. Ligaments with solid consistent endpoints including ACL, PCL, LCL, MCL. Negative Mcmurray's and provocative meniscal tests. Non painful patellar compression. Patellar and quadriceps tendons unremarkable. Hamstring and quadriceps strength is normal.  Procedure: Real-time Ultrasound Guided aspiration/injection of right knee Baker's cyst Device: GE Logiq E  Verbal informed consent obtained.  Time-out conducted.  Noted no overlying erythema, induration, or other signs of local infection.  Skin prepped in a sterile fashion.  Local anesthesia: Topical Ethyl chloride.  With sterile technique and under real time ultrasound guidance: Aspirated approximately 8 cc of clear, straw-colored fluid. Completed without difficulty  Pain immediately resolved suggesting accurate placement of the medication.  Advised to call if fevers/chills, erythema, induration, drainage, or persistent bleeding.  Images permanently stored and available for review in the ultrasound unit.  Impression: Technically successful ultrasound guided injection.  Impression and Recommendations:    Baker's cyst, right Now approximately 5 weeks post right knee arthroscopy with intra-articular loose body removal, chondroplasty with Dr. Everardo Pacific. She doing okay, she does have a persistent, and desire to have this aspirated today. 8cc aspiration with ultrasound guidance, keep further follow-up with Dr. Everardo Pacific. Now with mechanical component has been addressed she is now candidate for Visco supplementation. ___________________________________________ Ihor Austin. Benjamin Stain, M.D., ABFM., CAQSM. Primary Care and Sports Medicine [facility]  Adjunct Professor of Family Medicine  [employer]

## 2018-06-13 NOTE — Assessment & Plan Note (Signed)
Now approximately 5 weeks post right knee arthroscopy with intra-articular loose body removal, chondroplasty with Dr. Everardo Pacific. She doing okay, she does have a persistent, and desire to have this aspirated today. 8cc aspiration with ultrasound guidance, keep further follow-up with Dr. Everardo Pacific. Now with mechanical component has been addressed she is now candidate for Visco supplementation.

## 2018-06-17 ENCOUNTER — Ambulatory Visit (INDEPENDENT_AMBULATORY_CARE_PROVIDER_SITE_OTHER): Payer: No Typology Code available for payment source | Admitting: Sports Medicine

## 2018-06-17 DIAGNOSIS — M7121 Synovial cyst of popliteal space [Baker], right knee: Secondary | ICD-10-CM | POA: Diagnosis not present

## 2018-06-17 NOTE — Progress Notes (Signed)
Subjective:    CC: Right knee swelling  HPI: Vanessa Moss returns, she is approximately 5 weeks post right knee arthroscopy, she is having recurrence of swelling, she did go on a long hike over the weekend.  I have advised her in the past that she should keep her postoperative care with her surgeon.  Symptoms are moderate, persistent.  No mechanical symptoms, only swelling, is not really painful, she is however having a lack of range of motion.  I reviewed the past medical history, family history, social history, surgical history, and allergies today and no changes were needed.  Please see the problem list section below in epic for further details.  Past Medical History: Past Medical History:  Diagnosis Date  . Anxiety   . Arthritis   . Asthma    allergy induced none in 3-4 years  . Baker's cyst, right 01/25/2018   Past Surgical History: Past Surgical History:  Procedure Laterality Date  . ANTERIOR CRUCIATE LIGAMENT REPAIR Right   . CARPAL TUNNEL RELEASE Right   . CHONDROPLASTY Right 05/02/2018   Procedure: RIGHT KNEE CHONDROPLASTY;  Surgeon: Bjorn Pippin, MD;  Location: [facility];  Service: Orthopedics;  Laterality: Right;  . KNEE ARTHROSCOPY WITH LATERAL MENISECTOMY Right 05/02/2018   Procedure: RIGHT KNEE ARTHROSCOPY WITH MEDIAL MENISECTOMY;  Surgeon: Bjorn Pippin, MD;  Location: [facility];  Service: Orthopedics;  Laterality: Right;  . KNEE ARTHROSCOPY WITH MEDIAL MENISECTOMY  05/02/2018   Procedure: KNEE ARTHROSCOPY WITH MEDIAL MENISECTOMY;  Surgeon: Bjorn Pippin, MD;  Location: [facility];  Service: Orthopedics;;  . TUBAL LIGATION    . tubal ligation reversal     Social History: Social History   Socioeconomic History  . Marital status: Single    Spouse name: Not on file  . Number of children: Not on file  . Years of education: Not on file  . Highest education level: Not on file  Occupational History  . Not on file  Social Needs    . Financial resource strain: Not on file  . Food insecurity:    Worry: Not on file    Inability: Not on file  . Transportation needs:    Medical: Not on file    Non-medical: Not on file  Tobacco Use  . Smoking status: Never Smoker  . Smokeless tobacco: Never Used  Substance and Sexual Activity  . Alcohol use: Yes    Comment: occas  . Drug use: Never  . Sexual activity: Yes    Birth control/protection: Surgical    Comment: partner has had vasectomy   Lifestyle  . Physical activity:    Days per week: Not on file    Minutes per session: Not on file  . Stress: Not on file  Relationships  . Social connections:    Talks on phone: Not on file    Gets together: Not on file    Attends religious service: Not on file    Active member of club or organization: Not on file    Attends meetings of clubs or organizations: Not on file    Relationship status: Not on file  Other Topics Concern  . Not on file  Social History Narrative  . Not on file   Family History: Family History  Problem Relation Age of Onset  . Breast cancer Neg Hx    Allergies: Allergies  Allergen Reactions  . Penicillins Nausea Only    @ Gms Ancef IV with no obvious reaction  Medications: See med rec.  Review of Systems: No fevers, chills, night sweats, weight loss, chest pain, or shortness of breath.   Objective:    General: Well Developed, well nourished, and in no acute distress.  Neuro: Alert and oriented x3, extra-ocular muscles intact, sensation grossly intact.  HEENT: Normocephalic, atraumatic, pupils equal round reactive to light, neck supple, no masses, no lymphadenopathy, thyroid nonpalpable.  Skin: Warm and dry, no rashes. Cardiac: Regular rate and rhythm, no murmurs rubs or gallops, no lower extremity edema.  Respiratory: Clear to auscultation bilaterally. Not using accessory muscles, speaking in full sentences. Right knee: Well-healed arthroscopy portals, mild bruising, palpable  effusion. ROM normal in flexion and extension and lower leg rotation. Ligaments with solid consistent endpoints including ACL, PCL, LCL, MCL. Negative Mcmurray's and provocative meniscal tests. Non painful patellar compression. Patellar and quadriceps tendons unremarkable. Hamstring and quadriceps strength is normal.  Procedure: Real-time Ultrasound Guided aspiration of right knee Device: GE Logiq E  Verbal informed consent obtained.  Time-out conducted.  Noted no overlying erythema, induration, or other signs of local infection.  Skin prepped in a sterile fashion.  Local anesthesia: Topical Ethyl chloride.  With sterile technique and under real time ultrasound guidance:  Noted some synovitis, aspirated approximately 5 cc of clear, straw-colored fluid. Completed without difficulty  Pain immediately resolved suggesting accurate placement of the medication.  Advised to call if fevers/chills, erythema, induration, drainage, or persistent bleeding.  Images permanently stored and available for review in the ultrasound unit.  Impression: Technically successful ultrasound guided injection.  Impression and Recommendations:    Baker's cyst, right 6 weeks post right knee arthroscopy and intra-articular loose body removal with chondroplasty with Dr. Everardo Pacific. She does have an appointment coming up with him. She went on a long hike and now has increasing swelling. I am going to aspirate her knee for therapeutic purposes however she has been informed that she really needs to keep follow-up treatment with Dr. Everardo Pacific. She would be a candidate for Visco supplementation approval first before we start. ___________________________________________ Ihor Austin. Benjamin Stain, M.D., ABFM., CAQSM. Primary Care and Sports Medicine [facility]  Adjunct Professor of Family Medicine  [employer]

## 2018-06-17 NOTE — Assessment & Plan Note (Signed)
6 weeks post right knee arthroscopy and intra-articular loose body removal with chondroplasty with Dr. Everardo Pacific. She does have an appointment coming up with him. She went on a long hike and now has increasing swelling. I am going to aspirate her knee for therapeutic purposes however she has been informed that she really needs to keep follow-up treatment with Dr. Everardo Pacific. She would be a candidate for Visco supplementation approval first before we start.

## 2018-06-19 ENCOUNTER — Telehealth: Payer: Self-pay | Admitting: Sports Medicine

## 2018-06-19 NOTE — Telephone Encounter (Signed)
I called [employer] and spoke with Myriam Jacobson and she stated that per the policy the injections are a tier 3 and are covered for the patient. The patient will have to submit the bill to the insurance and they will apply it to the deductible. The patient has not met her deductible and will have to pay out of pocket for 4 injections.   I have called  the patient to let her know that she will have to pay out of pocket for 4 Orthovisc injections and she is willing to go ahead and get them scheduled. She will call the office to let us know when a good time will be to start the process.  I have made Dr. Darene Lamer aware that the patient is ok with paying out of pocket and he voices understanding. There was not further questions at this time.

## 2018-06-19 NOTE — Telephone Encounter (Signed)
-----   Message from Neldon Labella, CMA sent at 06/19/2018 10:49 AM EST ----- Information has been submitted to Orthovisc and awaiting determination.   Per [employer] is not active. I am calling insurance to see if Orthovisc is covered for this patient.  ----- Message ----- From: Monica Becton, MD Sent: 06/17/2018   4:36 PM EST To: Neldon Labella, CMA  Orthovisc approval please, right knee.X-ray confirmed, failed steroid injections, arthroscopy. ___________________________________________Thomas J. Benjamin Stain, M.D., ABFM., CAQSM.[facility] Care and [facility] Medicine[facility] KernersvilleAdjunct Professor of Family Medicine [employer]

## 2018-06-21 ENCOUNTER — Telehealth: Payer: Self-pay | Admitting: Family Medicine

## 2018-06-21 NOTE — Telephone Encounter (Signed)
Patient called in wanting to know if the injections for her knee would be a prescription or a medical procedure and she needed the coding for it so she can give her insurance company to see if it was covered. Please contact and advise.

## 2018-06-21 NOTE — Telephone Encounter (Signed)
Please advise 

## 2018-06-21 NOTE — Telephone Encounter (Signed)
Patient was notified of the codes and she will call her insurance to let them know the procedure codes and she will call back if there are any more questions. No further inquires during the call.

## 2018-06-21 NOTE — Telephone Encounter (Signed)
Routing to [place].

## 2018-06-21 NOTE — Telephone Encounter (Signed)
The code for Orthovisc is J7324.  It would be an office administered medication. The code for the injection procedure is 20611.

## 2018-07-02 ENCOUNTER — Other Ambulatory Visit: Payer: Self-pay

## 2018-07-02 ENCOUNTER — Ambulatory Visit (INDEPENDENT_AMBULATORY_CARE_PROVIDER_SITE_OTHER): Payer: No Typology Code available for payment source | Admitting: Sports Medicine

## 2018-07-02 DIAGNOSIS — M1711 Unilateral primary osteoarthritis, right knee: Secondary | ICD-10-CM

## 2018-07-02 NOTE — Assessment & Plan Note (Signed)
Now approximately 8 weeks post right knee arthroscopy with intra-articular loose body removal, chondroplasty. She will keep her postoperative evaluation with Dr. Everardo Pacific, we are starting Visco supplementation today. Orthovisc No. 1 of 4 into the right knee. Return in 1 week for Orthovisc No. 2 of 4 into the right knee.

## 2018-07-02 NOTE — Progress Notes (Signed)
   Procedure: Real-time Ultrasound Guided injection of the right knee Device: GE Logiq E  Verbal informed consent obtained.  Time-out conducted.  Noted no overlying erythema, induration, or other signs of local infection.  Skin prepped in a sterile fashion.  Local anesthesia: Topical Ethyl chloride.  With sterile technique and under real time ultrasound guidance:  30 mg/2 mL of OrthoVisc (sodium hyaluronate) in a prefilled syringe was injected easily into the knee through a 22-gauge needle. Completed without difficulty  Pain immediately resolved suggesting accurate placement of the medication.  Advised to call if fevers/chills, erythema, induration, drainage, or persistent bleeding.  Images permanently stored and available for review in the ultrasound unit.  Impression: Technically successful ultrasound guided injection. 

## 2018-07-04 ENCOUNTER — Other Ambulatory Visit: Payer: Self-pay

## 2018-07-04 ENCOUNTER — Encounter: Payer: Self-pay | Admitting: Rehabilitative and Restorative Service Providers"

## 2018-07-04 ENCOUNTER — Ambulatory Visit (INDEPENDENT_AMBULATORY_CARE_PROVIDER_SITE_OTHER): Payer: No Typology Code available for payment source | Admitting: Rehabilitative and Restorative Service Providers"

## 2018-07-04 DIAGNOSIS — M25561 Pain in right knee: Secondary | ICD-10-CM

## 2018-07-04 DIAGNOSIS — R2689 Other abnormalities of gait and mobility: Secondary | ICD-10-CM

## 2018-07-04 DIAGNOSIS — R29898 Other symptoms and signs involving the musculoskeletal system: Secondary | ICD-10-CM | POA: Diagnosis not present

## 2018-07-04 DIAGNOSIS — M6281 Muscle weakness (generalized): Secondary | ICD-10-CM

## 2018-07-04 NOTE — Patient Instructions (Signed)
Access Code: [email or web address]  URL: [email or web address]  Date: 07/04/2018  Prepared by: Corlis Leak   Exercises  Supine Hamstring Stretch with Strap - 10 reps - 1 sets - 30 seconds hold - 2x daily - 7x weekly  Supine Piriformis Stretch with Leg Straight - 3 reps - 1 sets - 30 seconds hold - 2x daily - 7x weekly  Prone Quadriceps Stretch with Strap - 3 reps - 1 sets - 30 seconds hold - 2x daily - 7x weekly  Supine Quad Set - 10 reps - 1-3 sets - 5-10sec hold - 2x daily - 7x weekly  Prone Quadriceps Set - 10 reps - 1 sets - 5-10 sec hold - 2x daily - 7x weekly  Straight Leg Raise with External Rotation - 10 reps - 1 sets - 5 sec hold - 2x daily - 7x weekly  Standing Eccentric Heel Raise - 10 reps - 2 sets - 2x daily - 7x weekly  Standing Single Leg Stance with Unilateral Counter Support - 3-5 reps - 1 sets - 20-30 sec hold - 2x daily - 7x weekly  Sidelying Hip Abduction - 10 reps - 2-3 sets - 5-10 sec hold - 2x daily - 7x weekly

## 2018-07-04 NOTE — Therapy (Signed)
[facility] [place] [address] [place], [place], [address] Phone: [phone]   Fax:  [phone]  Physical Therapy Evaluation  Patient Details  Name: Vanessa Moss MRN: [id number] Date of Birth: April 21, 1970 Referring Provider (PT): Dr Ramond Marrow   Encounter Date: 07/04/2018  PT End of Session - 07/04/18 1734    Visit Number  1    Number of Visits  12    Date for PT Re-Evaluation  08/15/18    PT Start Time  1618    PT Stop Time  1715    PT Time Calculation (min)  57 min    Activity Tolerance  Patient tolerated treatment well       Past Medical History:  Diagnosis Date  . Anxiety   . Arthritis   . Asthma    allergy induced none in 3-4 years  . Baker's cyst, right 01/25/2018    Past Surgical History:  Procedure Laterality Date  . ANTERIOR CRUCIATE LIGAMENT REPAIR Right   . CARPAL TUNNEL RELEASE Right   . CHONDROPLASTY Right 05/02/2018   Procedure: RIGHT KNEE CHONDROPLASTY;  Surgeon: Bjorn Pippin, MD;  Location: [facility];  Service: Orthopedics;  Laterality: Right;  . KNEE ARTHROSCOPY WITH LATERAL MENISECTOMY Right 05/02/2018   Procedure: RIGHT KNEE ARTHROSCOPY WITH MEDIAL MENISECTOMY;  Surgeon: Bjorn Pippin, MD;  Location: [facility];  Service: Orthopedics;  Laterality: Right;  . KNEE ARTHROSCOPY WITH MEDIAL MENISECTOMY  05/02/2018   Procedure: KNEE ARTHROSCOPY WITH MEDIAL MENISECTOMY;  Surgeon: Bjorn Pippin, MD;  Location: [facility];  Service: Orthopedics;;  . TUBAL LIGATION    . tubal ligation reversal      There were no vitals filed for this visit.   Subjective Assessment - 07/04/18 1626    Subjective  Patient reports initial injury 30 yrs ago with ACL repair. Recent surgery for arthroscopic debridement 05/02/2018. Symptoms have increased in the past several months.     Pertinent History  ACL repair 30 yrs ago; carpel tunnel surgery ~ 10 yrs ago     Patient Stated Goals   get Rt LE stronger    Currently in Pain?  No/denies    Pain Score  --   some days up to 5-6/10    Pain Location  Knee    Pain Orientation  Right    Pain Descriptors / Indicators  Aching;Dull    Pain Type  Chronic pain;Surgical pain    Pain Onset  More than a month ago    Pain Frequency  Intermittent    Aggravating Factors   fatigue; swelling in Baker's cyst     Pain Relieving Factors  ice          [facility] PT Assessment - 07/04/18 0001      Assessment   Medical Diagnosis  Rt knee scope/debridement     Referring Provider (PT)  Dr Ramond Marrow    Onset Date/Surgical Date  05/02/18    Hand Dominance  Right    Next MD Visit  08/06/2018    Prior Therapy  for prior surgery       Precautions   Precautions  None      Restrictions   Weight Bearing Restrictions  No      Balance Screen   Has the patient fallen in the past 6 months  Yes   knee "gives out" at times    How many times?  3    Has the patient  had a decrease in activity level because of a fear of falling?   No    Is the patient reluctant to leave their home because of a fear of falling?   No      Prior Function   Level of Independence  Independent    Vocation  Self employed    Vocation Requirements  desk/computer     Leisure  gym 2 hours/day 5 days/wk 1 hr cardio - [employer]; 1 hour wts free and machines       Observation/Other Assessments   Observations  decreased girth Rt thigh 46.9 cm; Lt 48 cm 20 cm proximal to lateral joint line knee     Skin Integrity  Bakers cyst posterior Rt knee tender and well defined     Focus on Therapeutic Outcomes (FOTO)   53% limitation       Sensation   Additional Comments  WNL's per pt report       AROM   Right/Left Hip  --   WNL's bilat    Right Knee Extension  -11    Right Knee Flexion  127    Left Knee Extension  3    Left Knee Flexion  138      Strength   Right Hip Flexion  4+/5    Right Hip Extension  4+/5    Right Hip ABduction  4/5    Right Hip ADduction  5/5     Right/Left Knee  --   Lt knee 5/5    Right Knee Flexion  4+/5    Right Knee Extension  4+/5    Right/Left Ankle  --   5/5 except    Right Ankle Plantar Flexion  4/5      Flexibility   Hamstrings  Rt 54; Lt 65    ITB  WFL's     Piriformis  tight Rt       Ambulation/Gait   Gait Comments  limp Rt LE in wt bearing phase of gait - knee in flexion in standing and walking       High Level Balance   High Level Balance Comments  SLS Rt 4-5 sec; Lt 6 sec                 Objective measurements completed on examination: See above findings.      [facility] Adult PT Treatment/Exercise - 07/04/18 0001      Knee/Hip Exercises: Stretches   Passive Hamstring Stretch  Right;2 reps;30 seconds   supine with strap    Quad Stretch  Right;2 reps;30 seconds   prone with strap    Piriformis Stretch  Right;2 reps;30 seconds   supine travell      Knee/Hip Exercises: Standing   Heel Raises  Right;10 reps;2 seconds   UE support as needed    SLS  20-30 sec x 3 reps Rt /2 reps Lt LE UE support as needed     Other Standing Knee Exercises  working on equal wt bearing in standing       Knee/Hip Exercises: Supine   Quad Sets  Strengthening;Right;10 reps   5 sec hold    Straight Leg Raise with External Rotation  Strengthening;Right;10 reps   5 sec hold slow eccentric lowering      Knee/Hip Exercises: Sidelying   Hip ABduction  Strengthening;Right;10 reps   hips forward; leading with heel slow eccentric lowering      Knee/Hip Exercises: Prone   Other Prone Exercises  quad set toe resting  on surface 10 sec x 10 reps              PT Education - 07/04/18 1719    Education Details  HEP     Person(s) Educated  Patient    Methods  Explanation;Demonstration;Tactile cues;Verbal cues    Comprehension  Verbalized understanding;Returned demonstration;Verbal cues required;Tactile cues required          PT Long Term Goals - 07/04/18 1740      PT LONG TERM GOAL #1   Title  Increase AROM  Rt knee to 0 deg extensin and 130 deg flexion 08/15/2018    Time  6    Period  Weeks    Status  New      PT LONG TERM GOAL #2   Title  Increase strength Rt LE to 5/5 throughout 08/15/2018    Time  6    Period  Weeks    Status  New      PT LONG TERM GOAL #3   Title  Progress patient to return to functional activities without falls or Rt LE weakness with functional activities 08/15/2018    Time  6    Period  Weeks    Status  New      PT LONG TERM GOAL #4   Title  Independent in HEP including return to gym with appropriate exercise program 08/15/2018    Time  6    Period  Weeks    Status  New      PT LONG TERM GOAL #5   Title  Improve FOTO to </= 39% limitation 08/15/2018    Time  6    Period  Weeks    Status  New             Plan - 07/04/18 1735    Clinical Impression Statement  Vanessa Moss presents s/p arthroscopic debridement Rt knee 05/02/2018 with continued pain; edema; weakness; abnormal gait pattern; limited functional activities. She will benefit from PT to address problems identified.     Personal Factors and Comorbidities  Comorbidity 1    Comorbidities  prior knee surgeries     Examination-Activity Limitations  Locomotion Level;Stand;Stairs;Squat;Bend;Caring for Others;Other    Clinical Decision Making  Low    Rehab Potential  Good    PT Frequency  2x / week    PT Duration  6 weeks    PT Treatment/Interventions  Patient/family education;ADLs/Self Care Home Management;Cryotherapy;Electrical Stimulation;Iontophoresis /ml Dexamethasone;Moist Heat;Ultrasound;Taping;Dry needling;Manual techniques;Therapeutic activities;Therapeutic exercise;Neuromuscular re-education    PT Next Visit Plan  review HEP; progress with exercises for stretching and strengthening Rt LE; standing and gait training as needed; modalities and manual work as indicated     Financial planner with Plan of Care  Patient       Patient will benefit from skilled therapeutic intervention in order to  improve the following deficits and impairments:  Postural dysfunction, Improper body mechanics, Pain, Increased muscle spasms, Decreased strength, Decreased range of motion, Decreased mobility, Abnormal gait, Decreased activity tolerance, Decreased balance  Visit Diagnosis: Acute pain of right knee - Plan: PT plan of care cert/re-cert  Muscle weakness (generalized) - Plan: PT plan of care cert/re-cert  Other symptoms and signs involving the musculoskeletal system - Plan: PT plan of care cert/re-cert  Other abnormalities of gait and mobility - Plan: PT plan of care cert/re-cert     Problem List Patient Active Problem List   Diagnosis Date Noted  . 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 S/P ACL reconstruction 01/25/2018    Gypsy Kellogg Rober Minion PT, MPH  07/04/2018, 5:44 PM  [facility] [address] [place], [place], [address] Phone: [phone]   Fax:  [phone]  Name: Vanessa Moss MRN: [id number] Date of Birth: 19-Feb-1971

## 2018-07-08 ENCOUNTER — Encounter: Payer: No Typology Code available for payment source | Admitting: Physical Therapy

## 2018-07-09 ENCOUNTER — Other Ambulatory Visit: Payer: Self-pay

## 2018-07-09 ENCOUNTER — Ambulatory Visit (INDEPENDENT_AMBULATORY_CARE_PROVIDER_SITE_OTHER): Payer: No Typology Code available for payment source | Admitting: Sports Medicine

## 2018-07-09 DIAGNOSIS — M1711 Unilateral primary osteoarthritis, right knee: Secondary | ICD-10-CM | POA: Diagnosis not present

## 2018-07-09 NOTE — Assessment & Plan Note (Signed)
Now 9 weeks post right knee arthroscopy, intra-articular loose body removal and chondroplasty. She has been taking easy, and is significantly better. Orthovisc No. 2 of 4 into the right knee, return in 1 week for #3.

## 2018-07-09 NOTE — Progress Notes (Signed)
   Procedure: Real-time Ultrasound Guided injection of the right knee Device: GE Logiq E  Verbal informed consent obtained.  Time-out conducted.  Noted no overlying erythema, induration, or other signs of local infection.  Skin prepped in a sterile fashion.  Local anesthesia: Topical Ethyl chloride.  With sterile technique and under real time ultrasound guidance:  30 mg/2 mL of OrthoVisc (sodium hyaluronate) in a prefilled syringe was injected easily into the knee through a 22-gauge needle. Completed without difficulty  Pain immediately resolved suggesting accurate placement of the medication.  Advised to call if fevers/chills, erythema, induration, drainage, or persistent bleeding.  Images permanently stored and available for review in the ultrasound unit.  Impression: Technically successful ultrasound guided injection. 

## 2018-07-11 ENCOUNTER — Encounter: Payer: No Typology Code available for payment source | Admitting: Rehabilitative and Restorative Service Providers"

## 2018-07-15 ENCOUNTER — Encounter: Payer: No Typology Code available for payment source | Admitting: Physical Therapy

## 2018-07-16 ENCOUNTER — Other Ambulatory Visit: Payer: Self-pay

## 2018-07-16 ENCOUNTER — Ambulatory Visit (INDEPENDENT_AMBULATORY_CARE_PROVIDER_SITE_OTHER): Payer: No Typology Code available for payment source | Admitting: Sports Medicine

## 2018-07-16 DIAGNOSIS — M1711 Unilateral primary osteoarthritis, right knee: Secondary | ICD-10-CM

## 2018-07-16 NOTE — Progress Notes (Signed)
Subjective:    CC: Knee pain and swelling  HPI: Vanessa Moss is here, the plan was to do an Orthovisc injection but she has noted worsening pain, swelling in her Baker's cyst, localized in the back of the knee, worsening over the past several days.  No trauma, no mechanical symptoms.  Worse with weightbearing and bending of the knee.  I reviewed the past medical history, family history, social history, surgical history, and allergies today and no changes were needed.  Please see the problem list section below in epic for further details.  Past Medical History: Past Medical History:  Diagnosis Date  . Anxiety   . Arthritis   . Asthma    allergy induced none in 3-4 years  . Baker's cyst, right 01/25/2018   Past Surgical History: Past Surgical History:  Procedure Laterality Date  . ANTERIOR CRUCIATE LIGAMENT REPAIR Right   . CARPAL TUNNEL RELEASE Right   . CHONDROPLASTY Right 05/02/2018   Procedure: RIGHT KNEE CHONDROPLASTY;  Surgeon: Bjorn Pippin, MD;  Location: [facility];  Service: Orthopedics;  Laterality: Right;  . KNEE ARTHROSCOPY WITH LATERAL MENISECTOMY Right 05/02/2018   Procedure: RIGHT KNEE ARTHROSCOPY WITH MEDIAL MENISECTOMY;  Surgeon: Bjorn Pippin, MD;  Location: [facility];  Service: Orthopedics;  Laterality: Right;  . KNEE ARTHROSCOPY WITH MEDIAL MENISECTOMY  05/02/2018   Procedure: KNEE ARTHROSCOPY WITH MEDIAL MENISECTOMY;  Surgeon: Bjorn Pippin, MD;  Location: [facility];  Service: Orthopedics;;  . TUBAL LIGATION    . tubal ligation reversal     Social History: Social History   Socioeconomic History  . Marital status: Single    Spouse name: Not on file  . Number of children: Not on file  . Years of education: Not on file  . Highest education level: Not on file  Occupational History  . Not on file  Social Needs  . Financial resource strain: Not on file  . Food insecurity:    Worry: Not on file    Inability: Not on file   . Transportation needs:    Medical: Not on file    Non-medical: Not on file  Tobacco Use  . Smoking status: Never Smoker  . Smokeless tobacco: Never Used  Substance and Sexual Activity  . Alcohol use: Yes    Comment: occas  . Drug use: Never  . Sexual activity: Yes    Birth control/protection: Surgical    Comment: partner has had vasectomy   Lifestyle  . Physical activity:    Days per week: Not on file    Minutes per session: Not on file  . Stress: Not on file  Relationships  . Social connections:    Talks on phone: Not on file    Gets together: Not on file    Attends religious service: Not on file    Active member of club or organization: Not on file    Attends meetings of clubs or organizations: Not on file    Relationship status: Not on file  Other Topics Concern  . Not on file  Social History Narrative  . Not on file   Family History: Family History  Problem Relation Age of Onset  . Breast cancer Neg Hx    Allergies: Allergies  Allergen Reactions  . Penicillins Nausea Only    @ Gms Ancef IV with no obvious reaction   Medications: See med rec.  Review of Systems: No fevers, chills, night sweats, weight loss, chest pain, or  shortness of breath.   Objective:    General: Well Developed, well nourished, and in no acute distress.  Neuro: Alert and oriented x3, extra-ocular muscles intact, sensation grossly intact.  HEENT: Normocephalic, atraumatic, pupils equal round reactive to light, neck supple, no masses, no lymphadenopathy, thyroid nonpalpable.  Skin: Warm and dry, no rashes. Cardiac: Regular rate and rhythm, no murmurs rubs or gallops, no lower extremity edema.  Respiratory: Clear to auscultation bilaterally. Not using accessory muscles, speaking in full sentences. Right knee: Visibly swollen, palpable and tense, tender Baker's cyst in the back of the knee. No pain at the joint lines. ROM normal in flexion and extension and lower leg rotation. Ligaments  with solid consistent endpoints including ACL, PCL, LCL, MCL. Negative Mcmurray's and provocative meniscal tests. Non painful patellar compression. Patellar and quadriceps tendons unremarkable. Hamstring and quadriceps strength is normal.  Procedure: Real-time Ultrasound Guided  aspiration of right knee Baker's cyst Device: GE Logiq E  Verbal informed consent obtained.  Time-out conducted.  Noted no overlying erythema, induration, or other signs of local infection.  Skin prepped in a sterile fashion.  Local anesthesia: Topical Ethyl chloride.  With sterile technique and under real time ultrasound guidance:  Using a 18-gauge needle advanced into the Baker's cyst and aspirated approximately 15 cc of clear, straw-colored fluid. Completed without difficulty  Pain immediately resolved suggesting accurate placement of the medication.  Advised to call if fevers/chills, erythema, induration, drainage, or persistent bleeding.  Images permanently stored and available for review in the ultrasound unit.  Impression: Technically successful ultrasound guided injection.  Procedure: Real-time Ultrasound Guided injection of the right knee Device: GE Logiq E  Verbal informed consent obtained.  Time-out conducted.  Noted no overlying erythema, induration, or other signs of local infection.  Skin prepped in a sterile fashion.  Local anesthesia: Topical Ethyl chloride.  With sterile technique and under real time ultrasound guidance:  30 mg/2 mL of OrthoVisc (sodium hyaluronate) in a prefilled syringe was injected easily into the knee through a 22-gauge needle. Completed without difficulty  Pain immediately resolved suggesting accurate placement of the medication.  Advised to call if fevers/chills, erythema, induration, drainage, or persistent bleeding.  Images permanently stored and available for review in the ultrasound unit.  Impression: Technically successful ultrasound guided injection.  Impression  and Recommendations:    Primary osteoarthritis of right knee with Baker's cyst Orthovisc No. 3 of 4 into the right knee. I also aspirated the Baker's cyst. She is about 10 weeks post right knee arthroscopy with intra-articular loose body removal, chondroplasty. I think it is okay to do some gentle walking, no aggressive hiking, rockclimbing. Return in 1 week for Orthovisc No. 4 of 4.   ___________________________________________ Ihor Austin. Benjamin Stain, M.D., ABFM., CAQSM. Primary Care and Sports Medicine [facility]  Adjunct Professor of Family Medicine  [employer]

## 2018-07-16 NOTE — Assessment & Plan Note (Signed)
Orthovisc No. 3 of 4 into the right knee. I also aspirated the Baker's cyst. She is about 10 weeks post right knee arthroscopy with intra-articular loose body removal, chondroplasty. I think it is okay to do some gentle walking, no aggressive hiking, rockclimbing. Return in 1 week for Orthovisc No. 4 of 4.

## 2018-07-18 ENCOUNTER — Encounter: Payer: No Typology Code available for payment source | Admitting: Rehabilitative and Restorative Service Providers"

## 2018-07-22 ENCOUNTER — Encounter: Payer: Self-pay | Admitting: Rehabilitative and Restorative Service Providers"

## 2018-07-23 ENCOUNTER — Ambulatory Visit: Payer: No Typology Code available for payment source | Admitting: Sports Medicine

## 2018-07-24 ENCOUNTER — Ambulatory Visit: Payer: No Typology Code available for payment source | Admitting: Sports Medicine

## 2018-07-24 ENCOUNTER — Encounter: Payer: No Typology Code available for payment source | Admitting: Rehabilitative and Restorative Service Providers"

## 2018-07-24 ENCOUNTER — Other Ambulatory Visit: Payer: Self-pay

## 2018-07-24 ENCOUNTER — Ambulatory Visit (INDEPENDENT_AMBULATORY_CARE_PROVIDER_SITE_OTHER): Payer: No Typology Code available for payment source | Admitting: Physical Therapy

## 2018-07-24 DIAGNOSIS — M1711 Unilateral primary osteoarthritis, right knee: Secondary | ICD-10-CM

## 2018-07-24 DIAGNOSIS — R29898 Other symptoms and signs involving the musculoskeletal system: Secondary | ICD-10-CM

## 2018-07-24 DIAGNOSIS — M6281 Muscle weakness (generalized): Secondary | ICD-10-CM

## 2018-07-24 DIAGNOSIS — M25561 Pain in right knee: Secondary | ICD-10-CM

## 2018-07-24 NOTE — Therapy (Signed)
[facility] [address] [place], [place], [address] Phone: [phone]   Fax:  [phone]  Patient Details  Name: Vanessa Moss MRN: [id number] Date of Birth: 1971-02-27 Referring Provider:  Bjorn Pippin, MD  Encounter Date: 07/24/2018  Pt arrived to appointment and informed therapist that she was advised by Dr. Benjamin Stain to cancel today's therapy appointment due to in-office procedures she had done.  Appointment was cancelled per pt request.   Pt voiced concern over her lack of of knee ext and inquired about her HEP.   HEP was revised and it was discussed with patient at length (with demonstrations provided). No exercises were performed by patient during conversation, except a seated quad set, for reinforcement of understanding.  A new handout was provided.  Patient verbalized understanding of exercises provided.  Patient scheduled visit for therapy for next week prior to departure.   Access Code: [email or web address]  URL: [email or web address]  Date: 07/24/2018  Prepared by: Mayer Camel   Exercises  Straight Leg Raise with External Rotation - 10 reps - 1 sets - 5 sec hold - 2x daily - 7x weekly  Sidelying Hip Abduction - 10 reps - 2-3 sets - 5-10 sec hold - 2x daily - 7x weekly  Supine Quad Set - 10 reps - 1-3 sets - 5-10sec hold - 2x daily - 7x weekly  Supine Piriformis Stretch with Leg Straight - 3 reps - 1 sets - 30 seconds hold - 2x daily - 7x weekly  Prone Quadriceps Stretch with Strap - 3 reps - 1 sets - 30 seconds hold - 2x daily - 7x weekly  Prone Quadriceps Set - 10 reps - 1 sets - 5-10 sec hold - 2x daily - 7x weekly  Standing Single Leg Stance with Unilateral Counter Support - 3-5 reps - 1 sets - 20-30 sec hold - 2x daily - 7x weekly  Hooklying Hamstring Stretch with Strap - 2-3 reps - 1 sets - 30 seconds hold - 2x daily - 7x weekly  Seated Hamstring Stretch - 2-3 reps - 1 sets - 30 seconds hold - 1x  daily - 7x weekly  Prone Knee Extension Hang - 2 reps - 1 sets - 1-2 min hold - 2x daily - 7x weekly  Gastroc Stretch on Wall - 2-3 reps - 1 sets - 30 seconds hold - 2x daily - 7x weekly  Seated Figure 4 Piriformis Stretch - 2-3 reps - 1 sets - 30 seconds hold - 2x daily - 7x weekly, hold on this until able. Seated passive knee ext stretch -30 seconds to 2 min, 2x/day.  Self massage with roller stick to quad, hamstring, calf, 2-3 min per day as needed.     Mayer Camel, PTA 07/24/18 11:02 AM  [facility] [address] [place], [place], [address] Phone: [phone]   Fax:  [phone]

## 2018-07-24 NOTE — Progress Notes (Signed)
   Procedure: Real-time Ultrasound Guided  aspiration of right knee Baker's cyst Device: GE Logiq E  Verbal informed consent obtained.  Time-out conducted.  Noted no overlying erythema, induration, or other signs of local infection.  Skin prepped in a sterile fashion.  Local anesthesia: Topical Ethyl chloride.  With sterile technique and under real time ultrasound guidance:  Using 18-gauge needle aspirated 13 cc of clear, straw-colored fluid from the Baker's cyst. Completed without difficulty  Pain immediately resolved suggesting accurate placement of the medication.  Advised to call if fevers/chills, erythema, induration, drainage, or persistent bleeding.  Images permanently stored and available for review in the ultrasound unit.  Impression: Technically successful ultrasound guided injection.  Procedure: Real-time Ultrasound Guided  aspiration/injection of right knee Device: GE Logiq E  Verbal informed consent obtained.  Time-out conducted.  Noted no overlying erythema, induration, or other signs of local infection.  Skin prepped in a sterile fashion.  Local anesthesia: Topical Ethyl chloride.  With sterile technique and under real time ultrasound guidance:  Advanced into the suprapatellar recess, aspirated 5 cc of clear, straw-colored fluid, syringe switched and 30 mg/2 mL of OrthoVisc (sodium hyaluronate) in a prefilled syringe was injected easily into the knee. Completed without difficulty  Pain immediately resolved suggesting accurate placement of the medication.  Advised to call if fevers/chills, erythema, induration, drainage, or persistent bleeding.  Images permanently stored and available for review in the ultrasound unit.  Impression: Technically successful ultrasound guided injection.

## 2018-07-24 NOTE — Assessment & Plan Note (Signed)
Pain-free now. Celebrex working well. Aspiration and Orthovisc No. 4 of 4. Baker's cyst aspirated as well. She is now about 11 weeks post arthroscopy with intra-articular loose body removal, chondroplasty. I am going to clear her for some hiking, we are going to give her a reaction knee brace today to be worn when out and about. I would like to see her back in a WebEx visit in 1 month to see how things are going.

## 2018-07-31 ENCOUNTER — Encounter: Payer: Self-pay | Admitting: Physical Therapy

## 2018-07-31 ENCOUNTER — Ambulatory Visit (INDEPENDENT_AMBULATORY_CARE_PROVIDER_SITE_OTHER): Payer: No Typology Code available for payment source | Admitting: Physical Therapy

## 2018-07-31 ENCOUNTER — Other Ambulatory Visit: Payer: Self-pay

## 2018-07-31 DIAGNOSIS — M6281 Muscle weakness (generalized): Secondary | ICD-10-CM | POA: Diagnosis not present

## 2018-07-31 DIAGNOSIS — M25561 Pain in right knee: Secondary | ICD-10-CM

## 2018-07-31 DIAGNOSIS — R29898 Other symptoms and signs involving the musculoskeletal system: Secondary | ICD-10-CM | POA: Diagnosis not present

## 2018-07-31 NOTE — Patient Instructions (Signed)
Access Code: [email or web address]  URL: [email or web address]  Date: 07/31/2018  Prepared by: Mayer Camel   Exercises  Forward T - 10 reps - 1 sets - 1x daily - 7x weekly  Single Leg Sit to Stand with Arms Extended - 10 reps - 1 sets - 1x daily - 7x weekly  Forward Step Down Touch with Heel - 10 reps - 1 sets - 1x daily - 7x weekly  Walking Forward Lunge - 10 reps - 1 sets - 1x daily - 7x weekly

## 2018-07-31 NOTE — Therapy (Addendum)
[place] [address] [place], [place], [address] Phone: [phone]   Fax:  [phone]  Physical Therapy Treatment  Patient Details  Name: Vanessa Moss MRN: [id number] Date of Birth: 1971/02/08 Referring Provider (PT): Dr Ophelia Charter   Encounter Date: 07/31/2018  PT End of Session - 07/31/18 1130    Visit Number  2    Number of Visits  12    Date for PT Re-Evaluation  08/15/18    PT Start Time  1118    PT Stop Time  1205    PT Time Calculation (min)  47 min    Activity Tolerance  Patient tolerated treatment well    Behavior During Therapy  [facility] for tasks assessed/performed       Past Medical History:  Diagnosis Date  . Anxiety   . Arthritis   . Asthma    allergy induced none in 3-4 years  . Baker's cyst, right 01/25/2018    Past Surgical History:  Procedure Laterality Date  . ANTERIOR CRUCIATE LIGAMENT REPAIR Right   . CARPAL TUNNEL RELEASE Right   . CHONDROPLASTY Right 05/02/2018   Procedure: RIGHT KNEE CHONDROPLASTY;  Surgeon: Hiram Gash, MD;  Location: [facility];  Service: Orthopedics;  Laterality: Right;  . KNEE ARTHROSCOPY WITH LATERAL MENISECTOMY Right 05/02/2018   Procedure: RIGHT KNEE ARTHROSCOPY WITH MEDIAL MENISECTOMY;  Surgeon: Hiram Gash, MD;  Location: [facility];  Service: Orthopedics;  Laterality: Right;  . KNEE ARTHROSCOPY WITH MEDIAL MENISECTOMY  05/02/2018   Procedure: KNEE ARTHROSCOPY WITH MEDIAL MENISECTOMY;  Surgeon: Hiram Gash, MD;  Location: [facility];  Service: Orthopedics;;  . TUBAL LIGATION    . tubal ligation reversal      There were no vitals filed for this visit.  Subjective Assessment - 07/31/18 1130    Subjective  Pt reports she has been doing some hard hikes without any pain, or swelling. She is most concerned about her ROM and "not trusting" her RLE when taking weight into her leg for rock climbing.     Patient Stated  Goals  get Rt LE stronger, straighter, more confidence in RLE    Currently in Pain?  No/denies    Pain Score  0-No pain         [facility] PT Assessment - 07/31/18 0001      AROM   Right Knee Extension  -4    Right Knee Flexion  135      Strength   Right Hip Flexion  5/5    Right Hip Extension  5/5    Right Hip ABduction  5/5    Right Hip ADduction  5/5    Right Knee Flexion  5/5    Right Knee Extension  5/5      Flexibility   Hamstrings  Rt 60 deg        [facility] Adult PT Treatment/Exercise - 07/31/18 0001      Self-Care   Self-Care  Other Self-Care Comments    Other Self-Care Comments   pt educated on self massage with roller stick and foam roller for LE tightness; demo provided; pt verbalized understanidng.       Knee/Hip Exercises: Stretches   Passive Hamstring Stretch  Right;4 reps;30 seconds;Left;2 reps   2 supine, 2 seated   Gastroc Stretch  Both;2 reps;30 seconds      Knee/Hip Exercises: Aerobic   Stationary Bike  L2: 6.5 min  Knee/Hip Exercises: Standing   Forward Step Up  Right;1 set;10 reps;Hand Hold: 1   12" step, eccentric return to ground   Step Down  Left;1 set;10 reps   (heel tap on 6" step)   Step Down Limitations  tactile and VC for hip / knee alignment.  some clicking heard during exercise.    Lunge Walking - Round Trips  20 ft, alterating feet, limited depth, cues for form.     SLS  Rt single leg sit to stand, 5 reps from elevated bed, 5 reps to chair (without UE support)       Knee/Hip Exercises: Seated   Other Seated Knee/Hip Exercises  long sitting, R quad set with overpressure into ext from pt.       Knee/Hip Exercises: Prone   Prone Knee Hang  1 minute             PT Education - 07/31/18 1227    Education Details  HEP - updated     Person(s) Educated  Patient    Methods  Explanation;Demonstration;Tactile cues;Verbal cues;Handout    Comprehension  Returned demonstration;Verbalized understanding          PT Long Term Goals -  07/31/18 1135      PT LONG TERM GOAL #1   Title  Increase AROM Rt knee to 0 deg extensin and 130 deg flexion 08/15/2018    Time  6    Period  Weeks    Status  Partially Met      PT LONG TERM GOAL #2   Title  Increase strength Rt LE to 5/5 throughout 08/15/2018    Period  Weeks    Status  Achieved      PT LONG TERM GOAL #3   Title  Progress patient to return to functional activities without falls or Rt LE weakness with functional activities 08/15/2018    Time  6    Period  Weeks    Status  On-going      PT LONG TERM GOAL #4   Title  Independent in HEP including return to gym with appropriate exercise program 08/15/2018    Time  6    Period  Weeks    Status  On-going      PT LONG TERM GOAL #5   Title  Improve FOTO to </= 39% limitation 08/15/2018    Time  6    Period  Weeks    Status  On-going            Plan - 07/31/18 1223    Clinical Impression Statement  Pt demonstrated improved Rt knee ROM and strength.  She tolerated all new strengthening exercises well, without increase in symptoms.  Hamstrings remain tight.  Pt has partially met her goals and is making good gains towards remaining goals.     Comorbidities  prior knee surgeries     Rehab Potential  Good    PT Frequency  2x / week    PT Duration  6 weeks    PT Treatment/Interventions  Patient/family education;ADLs/Self Care Home Management;Cryotherapy;Electrical Stimulation;Iontophoresis 21m/ml Dexamethasone;Moist Heat;Ultrasound;Taping;Dry needling;Manual techniques;Therapeutic activities;Therapeutic exercise;Neuromuscular re-education    PT Next Visit Plan  review HEP; progress with exercises for stretching and strengthening Rt LE    PT Home Exercise Plan  added [email or web address].     Consulted and Agree with Plan of Care  Patient       Patient will benefit from skilled therapeutic intervention in order to improve the following deficits and  impairments:     Visit Diagnosis: Acute pain of right knee  Muscle weakness  (generalized)  Other symptoms and signs involving the musculoskeletal system     Problem List Patient Active Problem List   Diagnosis Date Noted  . Primary osteoarthritis of right knee with Baker's cyst 01/25/2018  . S/P ACL reconstruction 01/25/2018   Kerin Perna, PTA 07/31/18 12:31 PM  [place] [address] [place], [place], [address] Phone: [phone]   Fax:  [phone]  Name: Vanessa Moss MRN: [id number] Date of Birth: 1971/03/09  PHYSICAL THERAPY DISCHARGE SUMMARY  Visits from Start of Care: 2  Current functional level related to goals / functional outcomes: See progress note for discharge status    Remaining deficits: Unknown    Education / Equipment: HEP  Plan: Patient agrees to discharge.  Patient goals were not met. Patient is being discharged due to not returning since the last visit.  ?????    Celyn P. Helene Kelp PT, MPH 09/18/18 4:55 PM

## 2018-08-14 ENCOUNTER — Encounter: Payer: Self-pay | Admitting: Physical Therapy

## 2018-08-21 ENCOUNTER — Ambulatory Visit (INDEPENDENT_AMBULATORY_CARE_PROVIDER_SITE_OTHER): Payer: No Typology Code available for payment source | Admitting: Sports Medicine

## 2018-08-21 DIAGNOSIS — M1711 Unilateral primary osteoarthritis, right knee: Secondary | ICD-10-CM

## 2018-08-21 NOTE — Progress Notes (Signed)
Virtual Visit via WebEx/MyChart   I connected with  Vanessa Moss  on 08/21/18 via WebEx/MyChart/Doximity Video and verified that I am speaking with the correct person using two identifiers.   I discussed the limitations, risks, security and privacy concerns of performing an evaluation and management service by WebEx/MyChart/Doximity Video, including the higher likelihood of inaccurate diagnosis and treatment, and the availability of in person appointments.  We also discussed the likely need of an additional face to face encounter for complete and high quality delivery of care.  I also discussed with the patient that there may be a patient responsible charge related to this service. The patient expressed understanding and wishes to proceed.  Provider location is either at home or medical facility. Patient location is at their home, different from provider location. People involved in care of the patient during this telehealth encounter were myself, my nurse/medical assistant, and my front office/scheduling team member.  Subjective:    CC: Follow-up  HPI: Doing well, pain-free.  I reviewed the past medical history, family history, social history, surgical history, and allergies today and no changes were needed.  Please see the problem list section below in epic for further details.  Past Medical History: Past Medical History:  Diagnosis Date  . Anxiety   . Arthritis   . Asthma    allergy induced none in 3-4 years  . Baker's cyst, right 01/25/2018   Past Surgical History: Past Surgical History:  Procedure Laterality Date  . ANTERIOR CRUCIATE LIGAMENT REPAIR Right   . CARPAL TUNNEL RELEASE Right   . CHONDROPLASTY Right 05/02/2018   Procedure: RIGHT KNEE CHONDROPLASTY;  Surgeon: Bjorn Pippin, MD;  Location: [facility];  Service: Orthopedics;  Laterality: Right;  . KNEE ARTHROSCOPY WITH LATERAL MENISECTOMY Right 05/02/2018   Procedure: RIGHT KNEE ARTHROSCOPY WITH MEDIAL  MENISECTOMY;  Surgeon: Bjorn Pippin, MD;  Location: [facility];  Service: Orthopedics;  Laterality: Right;  . KNEE ARTHROSCOPY WITH MEDIAL MENISECTOMY  05/02/2018   Procedure: KNEE ARTHROSCOPY WITH MEDIAL MENISECTOMY;  Surgeon: Bjorn Pippin, MD;  Location: [facility];  Service: Orthopedics;;  . TUBAL LIGATION    . tubal ligation reversal     Social History: Social History   Socioeconomic History  . Marital status: Single    Spouse name: Not on file  . Number of children: Not on file  . Years of education: Not on file  . Highest education level: Not on file  Occupational History  . Not on file  Social Needs  . Financial resource strain: Not on file  . Food insecurity:    Worry: Not on file    Inability: Not on file  . Transportation needs:    Medical: Not on file    Non-medical: Not on file  Tobacco Use  . Smoking status: Never Smoker  . Smokeless tobacco: Never Used  Substance and Sexual Activity  . Alcohol use: Yes    Comment: occas  . Drug use: Never  . Sexual activity: Yes    Birth control/protection: Surgical    Comment: partner has had vasectomy   Lifestyle  . Physical activity:    Days per week: Not on file    Minutes per session: Not on file  . Stress: Not on file  Relationships  . Social connections:    Talks on phone: Not on file    Gets together: Not on file    Attends religious service: Not on file  Active member of club or organization: Not on file    Attends meetings of clubs or organizations: Not on file    Relationship status: Not on file  Other Topics Concern  . Not on file  Social History Narrative  . Not on file   Family History: Family History  Problem Relation Age of Onset  . Breast cancer Neg Hx    Allergies: Allergies  Allergen Reactions  . Penicillins Nausea Only    @ Gms Ancef IV with no obvious reaction   Medications: See med rec.  Review of Systems: No fevers, chills, night sweats, weight  loss, chest pain, or shortness of breath.   Objective:    General: Speaking full sentences, no audible heavy breathing.  Sounds alert and appropriately interactive.  Appears well.  Face symmetric.  Extraocular movements intact.  Pupils equal and round.  No nasal flaring or accessory muscle use visualized.  No other physical exam performed due to the non-physical nature of this visit.  Impression and Recommendations:    Primary osteoarthritis of right knee with Baker's cyst 1 month ago we did the fourth Orthovisc injection as well as aspirated her Baker's cyst. She is now about 15 weeks post arthroscopy with intra-articular loose body removal, chondroplasty. She is absolutely pain-free, happy with how things are going. She is interested in proceeding with knee arthroplasty however, and understands she will need to talk to her surgeon about this.   I discussed the above assessment and treatment plan with the patient. The patient was provided an opportunity to ask questions and all were answered. The patient agreed with the plan and demonstrated an understanding of the instructions.   The patient was advised to call back or seek an in-person evaluation if the symptoms worsen or if the condition fails to improve as anticipated.   I provided 25 minutes of non-face-to-face time during this encounter, 15 minutes of additional time was needed to gather information, review chart, records, communicate/coordinate with staff remotely, troubleshooting the multiple errors that we get every time when trying to do video calls through the electronic medical record, WebEx, and Doximity, restart the encounter multiple times due to instability of the software, as well as complete documentation.   ___________________________________________ Ihor Austinhomas J. Benjamin Stainhekkekandam, M.D., ABFM., CAQSM. Primary Care and Sports Medicine  [facility]  Adjunct Professor of Family Medicine  [employer]  [employer]

## 2018-08-21 NOTE — Assessment & Plan Note (Signed)
1 month ago we did the fourth Orthovisc injection as well as aspirated her Baker's cyst. She is now about 15 weeks post arthroscopy with intra-articular loose body removal, chondroplasty. She is absolutely pain-free, happy with how things are going. She is interested in proceeding with knee arthroplasty however, and understands she will need to talk to her surgeon about this.

## 2018-10-10 ENCOUNTER — Encounter: Payer: Self-pay | Admitting: Rehabilitative and Restorative Service Providers"

## 2018-10-10 ENCOUNTER — Ambulatory Visit (INDEPENDENT_AMBULATORY_CARE_PROVIDER_SITE_OTHER): Payer: No Typology Code available for payment source | Admitting: Rehabilitative and Restorative Service Providers"

## 2018-10-10 ENCOUNTER — Encounter (INDEPENDENT_AMBULATORY_CARE_PROVIDER_SITE_OTHER): Payer: Self-pay

## 2018-10-10 DIAGNOSIS — R29898 Other symptoms and signs involving the musculoskeletal system: Secondary | ICD-10-CM | POA: Diagnosis not present

## 2018-10-10 DIAGNOSIS — M25561 Pain in right knee: Secondary | ICD-10-CM | POA: Diagnosis not present

## 2018-10-10 DIAGNOSIS — M6281 Muscle weakness (generalized): Secondary | ICD-10-CM | POA: Diagnosis not present

## 2018-10-10 DIAGNOSIS — R2689 Other abnormalities of gait and mobility: Secondary | ICD-10-CM | POA: Diagnosis not present

## 2018-10-10 NOTE — Patient Instructions (Addendum)
Access Code: [email or web address] URL: [email or web address]  Date: 10/10/2018  Prepared by: Gillermo Murdoch   Exercises  Supine Hamstring Stretch with Strap - 10 reps - 1 sets - 30 seconds hold - 2x daily - 7x weekly  Supine Quad Set - 10 reps - 1-2 sets - 10 sec hold - 2x daily - 7x weekly  Prone Quadriceps Stretch with Strap - 3 reps - 1 sets - 30 seconds hold - 2x daily - 7x weekly  Small Range Straight Leg Raise - 10 reps - 1-2 sets - 10 sec hold - 2x daily - 7x weekly  Sidelying Hip Abduction - 10 reps - 1-2 sets - 10 sec hold - 2x daily - 7x weekly  Supine Hip Adduction Isometric with Ball - 10 reps - 1-2 sets - 5-10 sec hold - 2x daily - 7x weekly  Hooklying Isometric Clamshell - 10 reps - 1-2 sets - 30 sec hold - 2x daily - 7x weekly  Supine Bridge - 10 reps - 1-2 sets - 10 sec hold - 2x daily - 7x weekly  Sit to Stand - 10 reps - 1-2 sets - 30 sec hold - 2x daily - 7x weekly

## 2018-10-10 NOTE — Therapy (Signed)
[facility] [address] [place], [place], [address] Phone: [phone]   Fax:  [phone]  Physical Therapy Evaluation  Patient Details  Name: Vanessa EhlersJill Moss MRN: [id number] Date of Birth: 1970-08-28 Referring Provider (PT): Dr Everardo PacificVarkey   Encounter Date: 10/10/2018  PT End of Session - 10/10/18 1507    Visit Number  1    Number of Visits  12    Date for PT Re-Evaluation  11/21/18    PT Start Time  1405    PT Stop Time  1500    PT Time Calculation (min)  55 min    Activity Tolerance  Patient tolerated treatment well       Past Medical History:  Diagnosis Date  . Anxiety   . Arthritis   . Asthma    allergy induced none in 3-4 years  . Baker's cyst, right 01/25/2018    Past Surgical History:  Procedure Laterality Date  . ANTERIOR CRUCIATE LIGAMENT REPAIR Right   . CARPAL TUNNEL RELEASE Right   . CHONDROPLASTY Right 05/02/2018   Procedure: RIGHT KNEE CHONDROPLASTY;  Surgeon: Bjorn PippinVarkey, Dax T, MD;  Location: [facility];  Service: Orthopedics;  Laterality: Right;  . KNEE ARTHROSCOPY WITH LATERAL MENISECTOMY Right 05/02/2018   Procedure: RIGHT KNEE ARTHROSCOPY WITH MEDIAL MENISECTOMY;  Surgeon: Bjorn PippinVarkey, Dax T, MD;  Location: [facility];  Service: Orthopedics;  Laterality: Right;  . KNEE ARTHROSCOPY WITH MEDIAL MENISECTOMY  05/02/2018   Procedure: KNEE ARTHROSCOPY WITH MEDIAL MENISECTOMY;  Surgeon: Bjorn PippinVarkey, Dax T, MD;  Location:  SUR[facility]ERY [facility];  Service: Orthopedics;;  . TUBAL LIGATION    . tubal ligation reversal      There were no vitals filed for this visit.   Subjective Assessment - 10/10/18 1510    Subjective  ACL Rt knee ~ 30 yr ago; fluid with multiple aspirations; Baker's cyst; arthroscopy with loose body removal 05/02/2018 with no significant improvement. She was seen in PT for 3 viists 07/04/18 to 07/31/2018. She has not been consistent with HEP and pain iin the knee has  increased.    Pertinent History  Rt knee ACL repair ~ 30 yrs ago; knee scope ~ 30 yrs ago    Patient Stated Goals  get better movement and "walk correctly"    Currently in Pain?  No/denies    Pain Location  Knee    Pain Orientation  Right;Medial;Posterior    Pain Descriptors / Indicators  Stabbing;Nagging    Pain Type  Chronic pain    Pain Radiating Towards  distal lateral quad    Pain Onset  More than a month ago    Pain Frequency  Intermittent    Aggravating Factors   following physical activity; squatting; high flexion; climbing; hiking; scuba diving    Pain Relieving Factors  lying with LR flexed and ER         [facility] - 10/10/18 0001      Assessment   Medical Diagnosis  Rt knee OA    Referring Provider (PT)  Dr Everardo PacificVarkey    Onset Date/Surgical Date  05/04/18    Hand Dominance  Right    Next MD Visit  none scheduled     Prior Therapy  here 3 visits       Precautions   Precautions  None      Restrictions   Weight Bearing Restrictions  No      Balance Screen   Has the patient fallen  in the past 6 months  Yes    How many times?  3-4 times - knee has "given out"     Has the patient had a decrease in activity level because of a fear of falling?   No    Is the patient reluctant to leave their home because of a fear of falling?   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     Leisure  active with hiking; rock climbing; Oncologistscuba diving; gym       Observation/Other Assessments   Observations  mild edema Rt knee compared to Northwest AirlinesLt     Focus on Therapeutic Outcomes (FOTO)   43% limitation       Sensation   Additional Comments  WFL's  per pt report       Posture/Postural Control   Posture Comments  wt shifted to the Lt in standing; Rt LE in ER       AROM   Left Knee Extension  0    Left Knee Flexion  140      Strength   Right Hip Flexion  4/5    Right Hip  Extension  4/5    Right Hip ABduction  4/5    Right Hip ADduction  4+/5    Left Hip Flexion  5/5    Left Hip Extension  5/5    Left Hip ABduction  5/5    Left Hip ADduction  5/5    Right Knee Flexion  4+/5    Right Knee Extension  4/5    Left Knee Flexion  5/5    Left Knee Extension  5/5      Flexibility   Hamstrings  Rt 64 deg  ; Lt 66 deg     Quadriceps  Rt 114 deg; Lt 118 deg     ITB  WFL's bilat     Piriformis  WFL's bilat       Palpation   Patella mobility  palpable Baker's cyst popliteal fossa     Palpation comment  tender joint line; distal quad Rt knee       Functional Gait  Assessment   Gait assessed   --   slight limp with Wt bearing Rt LE                Objective measurements completed on examination: See above findings.      [facility] Adult PT Treatment/Exercise - 10/10/18 0001      Knee/Hip Exercises: Stretches   Passive Hamstring Stretch  Right;2 reps;30 seconds    Quad Stretch  Right;2 reps;30 seconds      Knee/Hip Exercises: Seated   Sit to Starbucks CorporationSand  5 reps;without UE support   slow eccentric stand to sit - hip hinge/knees behind toes      Knee/Hip Exercises: Supine   Quad Sets  AROM;Strengthening;Right;5 reps   10 sec hold    Hip Adduction Isometric  Strengthening;Both;5 reps   ball squeeze 10 sec hold - hooklying    Bridges  Strengthening;Both;5 reps   10 sec hold    Straight Leg Raises  Strengthening;Right;5 reps   10 sec hold 12 in lift quad set to SLR    Other Supine Knee/Hip Exercises  clam blue TB hooklying alternating LE's       Knee/Hip Exercises: Sidelying   Hip ABduction  Strengthening;Right;10  reps   hips fwd; leading with heel             PT Education - 10/10/18 1553    Education Details  HEP    Person(s) Educated  Patient    Methods  Explanation;Demonstration;Tactile cues;Verbal cues;Handout    Comprehension  Verbalized understanding;Returned demonstration;Verbal cues required;Tactile cues required          PT  Long Term Goals - 10/10/18 1750      PT LONG TERM GOAL #1   Title  8/6    Time  6    Period  Weeks    Status  New      PT LONG TERM GOAL #2   Title  Increase strength Rt LE to 5/5 throughout 11/21/2018    Time  6    Period  Weeks    Status  New      PT LONG TERM GOAL #3   Title  Progress patient to return to functional activities without falls or Rt LE weakness with functional activities 11/21/2018    Time  6    Period  Weeks    Status  New      PT LONG TERM GOAL #4   Title  Independent in HEP including return to gym with appropriate exercise program 11/21/2018    Time  6    Period  Weeks    Status  New      PT LONG TERM GOAL #5   Title  Improve FOTO to </= 33% limitation 11/21/2018    Time  6    Period  Weeks    Status  New             Plan - 10/10/18 1602    Clinical Impression Statement  Sawyer presents with persistent, chronic pain Rt knee. She underwent arthroscopic sx for removal of loose bodies 05/04/17 and was seen for 3 PT visits 3/19-4/19. She has returned to most normal functioinal and recreational activities but has not done her exercises as instructed in rehab. Merly reports continued pain and stiffness in the Rt knee with weakness with her hiking and rock climbing activities. She has pain on an intermittent basis related to activity level. She has abnormal gait pattern; Rt knee edema; limited Rt knee ROM; decreased Rt LE strength and function. Patient will benefit form PT to address problems identified.    Stability/Clinical Decision Making  Stable/Uncomplicated    Clinical Decision Making  Low    Rehab Potential  Good    PT Frequency  2x / week    PT Duration  6 weeks    PT Treatment/Interventions  Patient/family education;ADLs/Self Care Home Management;Neuromuscular re-education;Therapeutic activities;Therapeutic exercise;Cryotherapy;Electrical Stimulation;Iontophoresis 4mg /ml Dexamethasone;Moist Heat;Ultrasound;Manual techniques;Dry needling;Vasopneumatic Device     PT Next Visit Plan  revies HEP; progress with stretching/ROM; strengthening; modalities as indicated    PT Home Exercise Plan  K ZDNYAEX       Patient will benefit from skilled therapeutic intervention in order to improve the following deficits and impairments:  Decreased strength, Decreased mobility, Decreased range of motion, Abnormal gait, Pain, Decreased activity tolerance  Visit Diagnosis: 1. Acute pain of right knee   2. Muscle weakness (generalized)   3. Other symptoms and signs involving the musculoskeletal system   4. Other abnormalities of gait and mobility        Problem List Patient Active Problem List   Diagnosis Date Noted  . Primary osteoarthritis of right knee with Baker's cyst 01/25/2018  . S/P  ACL reconstruction 01/25/2018    Quandarius Nill Rober MinionP Delissa Silba PT, MPH  10/10/2018, 5:55 PM  [facility] [address] [place], [place], [address] Phone: [phone]   Fax:  [phone]  Name: Vanessa EhlersJill Moss MRN: [id number] Date of Birth: May 14, 1970

## 2018-10-16 ENCOUNTER — Ambulatory Visit (INDEPENDENT_AMBULATORY_CARE_PROVIDER_SITE_OTHER): Payer: No Typology Code available for payment source | Admitting: Physical Therapy

## 2018-10-16 ENCOUNTER — Other Ambulatory Visit: Payer: Self-pay

## 2018-10-16 DIAGNOSIS — M6281 Muscle weakness (generalized): Secondary | ICD-10-CM | POA: Diagnosis not present

## 2018-10-16 DIAGNOSIS — R2689 Other abnormalities of gait and mobility: Secondary | ICD-10-CM | POA: Diagnosis not present

## 2018-10-16 DIAGNOSIS — M25561 Pain in right knee: Secondary | ICD-10-CM

## 2018-10-16 DIAGNOSIS — R29898 Other symptoms and signs involving the musculoskeletal system: Secondary | ICD-10-CM

## 2018-10-16 NOTE — Therapy (Signed)
[facility] [address] [place], [place], [address] Phone: [phone]   Fax:  [phone]  Physical Therapy Treatment  Patient Details  Name: Vanessa Moss MRN: [id number] Date of Birth: Dec 04, 1970 Referring Provider (PT): Dr Everardo PacificVarkey   Encounter Date: 10/16/2018  PT End of Session - 10/16/18 1209    Visit Number  2    Number of Visits  12    Date for PT Re-Evaluation  11/21/18    PT Start Time  1203    PT Stop Time  1244    PT Time Calculation (min)  41 min    Activity Tolerance  Patient tolerated treatment well;No increased pain       Past Medical History:  Diagnosis Date  . Anxiety   . Arthritis   . Asthma    allergy induced none in 3-4 years  . Baker's cyst, right 01/25/2018    Past Surgical History:  Procedure Laterality Date  . ANTERIOR CRUCIATE LIGAMENT REPAIR Right   . CARPAL TUNNEL RELEASE Right   . CHONDROPLASTY Right 05/02/2018   Procedure: RIGHT KNEE CHONDROPLASTY;  Surgeon: Bjorn PippinVarkey, Dax T, MD;  Location: [facility];  Service: Orthopedics;  Laterality: Right;  . KNEE ARTHROSCOPY WITH LATERAL MENISECTOMY Right 05/02/2018   Procedure: RIGHT KNEE ARTHROSCOPY WITH MEDIAL MENISECTOMY;  Surgeon: Bjorn PippinVarkey, Dax T, MD;  Location: [facility];  Service: Orthopedics;  Laterality: Right;  . KNEE ARTHROSCOPY WITH MEDIAL MENISECTOMY  05/02/2018   Procedure: KNEE ARTHROSCOPY WITH MEDIAL MENISECTOMY;  Surgeon: Bjorn PippinVarkey, Dax T, MD;  Location: [facility];  Service: Orthopedics;;  . TUBAL LIGATION    . tubal ligation reversal      There were no vitals filed for this visit.  Subjective Assessment - 10/16/18 1209    Subjective  Pt reports she went climbing on Sunday and climbing equivilant to the [employer].  Her Lt quad has been so painful that she hasn't done anything for 2 days.    Pertinent History  Rt knee ACL repair ~ 30 yrs ago; knee scope ~ 30 yrs ago    Patient  Stated Goals  get better movement and "walk correctly"    Currently in Pain?  Yes    Pain Score  8     Pain Location  Leg    Pain Orientation  Left    Pain Descriptors / Indicators  Sharp;Tightness    Aggravating Factors   climbing    Pain Relieving Factors  ?         [facility] - 10/16/18 0001      Assessment   Medical Diagnosis  Rt knee OA    Referring Provider (PT)  Dr Everardo PacificVarkey    Onset Date/Surgical Date  05/04/18    Hand Dominance  Right    Next MD Visit  none scheduled     Prior Therapy  here 3 visits       AROM   Right Knee Extension  -8    Right Knee Flexion  130    Left Knee Extension  -2    Left Knee Flexion  135      Flexibility   Quadriceps  Rt 115 deg; Lt 118 deg         [facility] Adult PT Treatment/Exercise - 10/16/18 0001      Ambulation/Gait   Ambulation/Gait  Yes    Ambulation/Gait Assistance  5: Supervision    Ambulation/Gait Assistance Details  short  trials of 40-80 ft with cues for increased wt shift Rt, increased Rt heel strike and increased Rt toe off     Ambulation Distance (Feet)  360 Feet    Assistive device  None    Gait Pattern  Step-through pattern;Decreased arm swing - right;Decreased arm swing - left;Decreased dorsiflexion - right;Decreased weight shift to right;Right hip hike;Right flexed knee in stance      Self-Care   Self-Care  Other Self-Care Comments;Heat/Ice Application    Heat/Ice Application  reviewed parameters for heat / ice application     Other Self-Care Comments   pt re-educated on self massage with roller stick and foam roller for LE tightness; demo provided; pt verbalized understanidng.       Knee/Hip Exercises: Stretches   Passive Hamstring Stretch  Right;Left;2 reps;30 seconds   supine   Quad Stretch  Right;Left;3 reps;60 seconds    Gastroc Stretch  Both;30 seconds;3 reps   incline board     Knee/Hip Exercises: Aerobic   Recumbent Bike  L2: 7 min       Knee/Hip Exercises: Standing   Step Down  Left;1 set;10  reps   focus on slow eccentric lowering with RLE     Modalities   Modalities  Moist Heat      Moist Heat Therapy   Number Minutes Moist Heat  10 Minutes   applied during stretches   Moist Heat Location  Lumbar Spine   Lt thigh            PT Education - 10/16/18 1301    Education Details  heat/ice application parameters, self massage; importance of compliance wiht HEP.    Person(s) Educated  Patient    Methods  Explanation    Comprehension  Verbalized understanding          PT Long Term Goals - 10/10/18 1750      PT LONG TERM GOAL #1   Title  8/6    Time  6    Period  Weeks    Status  New      PT LONG TERM GOAL #2   Title  Increase strength Rt LE to 5/5 throughout 11/21/2018    Time  6    Period  Weeks    Status  New      PT LONG TERM GOAL #3   Title  Progress patient to return to functional activities without falls or Rt LE weakness with functional activities 11/21/2018    Time  6    Period  Weeks    Status  New      PT LONG TERM GOAL #4   Title  Independent in HEP including return to gym with appropriate exercise program 11/21/2018    Time  6    Period  Weeks    Status  New      PT LONG TERM GOAL #5   Title  Improve FOTO to </= 33% limitation 11/21/2018    Time  6    Period  Weeks    Status  New            Plan - 10/16/18 1258    Clinical Impression Statement  Pt initially complaining of Lt thigh pain due to compensating (for Rt knee) while climbing/ hiking 3 days ago.  Pain reduced with exercises and self massage.  Gait quality improved with repetition and cues.  Encouraged compliance with gait trials and HEP to assist achieving goals.    Stability/Clinical Decision Making  Stable/Uncomplicated  Rehab Potential  Good    PT Frequency  2x / week    PT Duration  6 weeks    PT Treatment/Interventions  Patient/family education;ADLs/Self Care Home Management;Neuromuscular re-education;Therapeutic activities;Therapeutic exercise;Cryotherapy;Electrical  Stimulation;Iontophoresis 4mg /ml Dexamethasone;Moist Heat;Ultrasound;Manual techniques;Dry needling;Vasopneumatic Device    PT Next Visit Plan  revies HEP; progress with stretching/ROM; strengthening; modalities as indicated    PT Home Exercise Plan  K ZDNYAEX       Patient will benefit from skilled therapeutic intervention in order to improve the following deficits and impairments:  Decreased strength, Decreased mobility, Decreased range of motion, Abnormal gait, Pain, Decreased activity tolerance  Visit Diagnosis: 1. Acute pain of right knee   2. Muscle weakness (generalized)   3. Other symptoms and signs involving the musculoskeletal system   4. Other abnormalities of gait and mobility        Problem List Patient Active Problem List   Diagnosis Date Noted  . Primary osteoarthritis of right knee with Baker's cyst 01/25/2018  . S/P ACL reconstruction 01/25/2018   Mayer CamelJennifer Carlson-Long, PTA 10/16/18 1:08 PM  [facility] [address] [place], [place], [address] Phone: [phone]   Fax:  [phone]  Name: Vanessa Moss MRN: [id number] Date of Birth: Jun 30, 1970

## 2018-10-23 ENCOUNTER — Other Ambulatory Visit: Payer: Self-pay

## 2018-10-23 ENCOUNTER — Encounter: Payer: Self-pay | Admitting: Rehabilitative and Restorative Service Providers"

## 2018-10-23 ENCOUNTER — Ambulatory Visit (INDEPENDENT_AMBULATORY_CARE_PROVIDER_SITE_OTHER): Payer: No Typology Code available for payment source | Admitting: Rehabilitative and Restorative Service Providers"

## 2018-10-23 DIAGNOSIS — R29898 Other symptoms and signs involving the musculoskeletal system: Secondary | ICD-10-CM

## 2018-10-23 DIAGNOSIS — M6281 Muscle weakness (generalized): Secondary | ICD-10-CM

## 2018-10-23 DIAGNOSIS — R2689 Other abnormalities of gait and mobility: Secondary | ICD-10-CM

## 2018-10-23 DIAGNOSIS — M25561 Pain in right knee: Secondary | ICD-10-CM

## 2018-10-23 NOTE — Therapy (Signed)
[facility] [address] [place], [place], [address] Phone: [phone]   Fax:  [phone]  Physical Therapy Treatment  Patient Details  Name: Vanessa Moss MRN: [id number] Date of Birth: April 20, 1970 Referring Provider (PT): Dr Everardo PacificVarkey   Encounter Date: 10/23/2018  PT End of Session - 10/23/18 1607    Visit Number  3    Number of Visits  12    Date for PT Re-Evaluation  11/21/18    PT Start Time  1603    PT Stop Time  1645    PT Time Calculation (min)  42 min    Activity Tolerance  Patient tolerated treatment well       Past Medical History:  Diagnosis Date  . Anxiety   . Arthritis   . Asthma    allergy induced none in 3-4 years  . Baker's cyst, right 01/25/2018    Past Surgical History:  Procedure Laterality Date  . ANTERIOR CRUCIATE LIGAMENT REPAIR Right   . CARPAL TUNNEL RELEASE Right   . CHONDROPLASTY Right 05/02/2018   Procedure: RIGHT KNEE CHONDROPLASTY;  Surgeon: Bjorn PippinVarkey, Dax T, MD;  Location: [facility];  Service: Orthopedics;  Laterality: Right;  . KNEE ARTHROSCOPY WITH LATERAL MENISECTOMY Right 05/02/2018   Procedure: RIGHT KNEE ARTHROSCOPY WITH MEDIAL MENISECTOMY;  Surgeon: Bjorn PippinVarkey, Dax T, MD;  Location: [facility];  Service: Orthopedics;  Laterality: Right;  . KNEE ARTHROSCOPY WITH MEDIAL MENISECTOMY  05/02/2018   Procedure: KNEE ARTHROSCOPY WITH MEDIAL MENISECTOMY;  Surgeon: Bjorn PippinVarkey, Dax T, MD;  Location: [facility];  Service: Orthopedics;;  . TUBAL LIGATION    . tubal ligation reversal      There were no vitals filed for this visit.  Subjective Assessment - 10/23/18 1608    Subjective  Patient reports that she rested more in the past week - she has less swelling and discomfort in the back of the Rt knee. Patient reports that she has done the PT exercises but not every day. She does not like the exercises because they pull the knee and the cyst hurts - MD will  not remove the cyst.    Currently in Pain?  Yes    Pain Score  0-No pain         [facility] PT Assessment - 10/23/18 0001      Assessment   Medical Diagnosis  Rt knee OA    Referring Provider (PT)  Dr Everardo PacificVarkey    Onset Date/Surgical Date  05/04/18    Hand Dominance  Right    Next MD Visit  none scheduled     Prior Therapy  here 3 visits       AROM   Right Knee Extension  -6      PROM   Right Knee Extension  -5                   [facility] Adult PT Treatment/Exercise - 10/23/18 0001      Knee/Hip Exercises: Stretches   Passive Hamstring Stretch  Right;Left;2 reps;30 seconds   supine   Quad Stretch  Right;Left;30 seconds;3 reps   supine with strap      Knee/Hip Exercises: Aerobic   Recumbent Bike  L2: 6 min       Knee/Hip Exercises: Standing   Lateral Step Up  Right;15 reps;Hand Hold: 2;Step Height: 4";20 reps   heel tap Lt LE-focus on technique/avoid compensatory pattern   Forward Step Up  Right;15 reps;Hand Hold:  2;Step Height: 6"    SLS  20-30 sec x 2 repeated with cushion 20-30 sec x 2 UE support as needed     SLS with Vectors  forward touch x 10 Rt SLS       Knee/Hip Exercises: Seated   Sit to Sand  10 reps   slow eccentric stand to sit      Knee/Hip Exercises: Supine   Quad Sets  AROM;Strengthening;Right;5 reps   5 sec hold - heel resting on roll    Short Arc Quad Sets  Strengthening;Right;10 reps    Straight Leg Raises  Strengthening;Right;10 reps    Straight Leg Raise with External Rotation  Strengthening;Right;10 reps      Knee/Hip Exercises: Sidelying   Hip ABduction  Strengthening;Right;10 reps    Hip ABduction Limitations  rainbow toe tap fwd/back x 10 reps Rt LE              PT Education - 10/23/18 1703    Education Details  HEP - education re importance of HEP and of grading physical activity    Person(s) Educated  Patient    Methods  Explanation;Demonstration;Tactile cues;Verbal cues;Handout    Comprehension  Verbalized  understanding;Returned demonstration;Verbal cues required;Tactile cues required          PT Long Term Goals - 10/10/18 1750      PT LONG TERM GOAL #1   Title  8/6    Time  6    Period  Weeks    Status  New      PT LONG TERM GOAL #2   Title  Increase strength Rt LE to 5/5 throughout 11/21/2018    Time  6    Period  Weeks    Status  New      PT LONG TERM GOAL #3   Title  Progress patient to return to functional activities without falls or Rt LE weakness with functional activities 11/21/2018    Time  6    Period  Weeks    Status  New      PT LONG TERM GOAL #4   Title  Independent in HEP including return to gym with appropriate exercise program 11/21/2018    Time  6    Period  Weeks    Status  New      PT LONG TERM GOAL #5   Title  Improve FOTO to </= 33% limitation 11/21/2018    Time  6    Period  Weeks    Status  New            Plan - 10/23/18 1608    Clinical Impression Statement  Patient reports decreased pain this week - related to decreased activity last week - less swelling in the posterior knee. Patient has not been consistent with HEP. Encouraged her to work on exercises and grade activity in the gym and with hiking/climbing etc to avoid overuse. Patient added specific exercises for strengthening today with focus on Rt LE strengthening. Fatigued quickly. Slight improvement in Rt knee extensin but remains limited at (-) 6 deg extension.    Stability/Clinical Decision Making  Stable/Uncomplicated    Rehab Potential  Good    PT Frequency  2x / week    PT Duration  6 weeks    PT Treatment/Interventions  Patient/family education;ADLs/Self Care Home Management;Neuromuscular re-education;Therapeutic activities;Therapeutic exercise;Cryotherapy;Electrical Stimulation;Iontophoresis 4mg /ml Dexamethasone;Moist Heat;Ultrasound;Manual techniques;Dry needling;Vasopneumatic Device    PT Next Visit Plan  revies HEP; progress with stretching/ROM; strengthening; modalities as indicated  PT Home Exercise Plan  K ZDNYAEX       Patient will benefit from skilled therapeutic intervention in order to improve the following deficits and impairments:  Decreased strength, Decreased mobility, Decreased range of motion, Abnormal gait, Pain, Decreased activity tolerance  Visit Diagnosis: 1. Acute pain of right knee   2. Muscle weakness (generalized)   3. Other symptoms and signs involving the musculoskeletal system   4. Other abnormalities of gait and mobility        Problem List Patient Active Problem List   Diagnosis Date Noted  . Primary osteoarthritis of right knee with Baker's cyst 01/25/2018  . S/P ACL reconstruction 01/25/2018    Jonnie Truxillo Rober MinionP Juanisha Bautch PT, MPH  10/23/2018, 5:04 PM  [facility] [address] [place], [place], [address] Phone: [phone]   Fax:  [phone]  Name: Vanessa Moss MRN: [id number] Date of Birth: 19-Nov-1970

## 2018-10-23 NOTE — Patient Instructions (Signed)
Access Code: [facility]  URL: [email or web address]  Date: 10/23/2018  Prepared by: Gillermo Murdoch   Exercises  Supine Hamstring Stretch with Strap - 10 reps - 1 sets - 30 seconds hold - 2x daily - 7x weekly  Supine Quad Set - 10 reps - 1-2 sets - 10 sec hold - 2x daily - 7x weekly  Prone Quadriceps Stretch with Strap - 3 reps - 1 sets - 30 seconds hold - 2x daily - 7x weekly  Small Range Straight Leg Raise - 10 reps - 1-2 sets - 10 sec hold - 2x daily - 7x weekly  Sidelying Hip Abduction - 10 reps - 1-2 sets - 10 sec hold - 2x daily - 7x weekly  Supine Hip Adduction Isometric with Ball - 10 reps - 1-2 sets - 5-10 sec hold - 2x daily - 7x weekly  Hooklying Isometric Clamshell - 10 reps - 1-2 sets - 30 sec hold - 2x daily - 7x weekly  Supine Bridge - 10 reps - 1-2 sets - 10 sec hold - 2x daily - 7x weekly   Today  Sit to Stand - 10 reps - 1-2 sets - 30 sec hold - 2x daily - 7x weekly  Straight Leg Raise with External Rotation - 3 reps - 1 sets - 30 sec hold - 2x daily - 7x weekly  Sidelying Hip External Rotation in Abduction - 10 reps - 1-3 sets - 5-10 sec hold - 1x daily - 7x weekly  Sidelying Hip Extension in Abduction - 10 reps - 1-3 sets - 1x daily - 7x weekly  Forward T - 10 reps - 1-3 sets - 1x daily - 7x weekly  Single Leg Balance on Foam - 4-5 reps - 1-2 sets - 30 sec hold - 1x daily - 7x weekly  Step Up - 10 reps - 1-3 sets - 2x daily - 7x weekly  Lateral Step Ups - 10 reps - 1-3 sets - 2x daily - 7x weekly

## 2018-10-25 ENCOUNTER — Other Ambulatory Visit: Payer: Self-pay

## 2018-10-25 ENCOUNTER — Ambulatory Visit (INDEPENDENT_AMBULATORY_CARE_PROVIDER_SITE_OTHER): Payer: No Typology Code available for payment source | Admitting: Physical Therapy

## 2018-10-25 DIAGNOSIS — M6281 Muscle weakness (generalized): Secondary | ICD-10-CM | POA: Diagnosis not present

## 2018-10-25 DIAGNOSIS — R29898 Other symptoms and signs involving the musculoskeletal system: Secondary | ICD-10-CM

## 2018-10-25 DIAGNOSIS — R2689 Other abnormalities of gait and mobility: Secondary | ICD-10-CM | POA: Diagnosis not present

## 2018-10-25 DIAGNOSIS — M25561 Pain in right knee: Secondary | ICD-10-CM

## 2018-10-25 NOTE — Therapy (Signed)
[facility] [address] [place], [place], [address] Phone: [phone]   Fax:  [phone]  Physical Therapy Treatment  Patient Details  Name: Vanessa Moss MRN: [id number] Date of Birth: January 14, 1971 Referring Provider (PT): Dr Everardo PacificVarkey   Encounter Date: 10/25/2018  PT End of Session - 10/25/18 1536    Visit Number  4    Number of Visits  12    Date for PT Re-Evaluation  11/21/18    PT Start Time  1530    PT Stop Time  1604    PT Time Calculation (min)  34 min    Activity Tolerance  Patient tolerated treatment well    Behavior During Therapy  [facility] for tasks assessed/performed       Past Medical History:  Diagnosis Date  . Anxiety   . Arthritis   . Asthma    allergy induced none in 3-4 years  . Baker's cyst, right 01/25/2018    Past Surgical History:  Procedure Laterality Date  . ANTERIOR CRUCIATE LIGAMENT REPAIR Right   . CARPAL TUNNEL RELEASE Right   . CHONDROPLASTY Right 05/02/2018   Procedure: RIGHT KNEE CHONDROPLASTY;  Surgeon: Bjorn PippinVarkey, Dax T, MD;  Location: [facility];  Service: Orthopedics;  Laterality: Right;  . KNEE ARTHROSCOPY WITH LATERAL MENISECTOMY Right 05/02/2018   Procedure: RIGHT KNEE ARTHROSCOPY WITH MEDIAL MENISECTOMY;  Surgeon: Bjorn PippinVarkey, Dax T, MD;  Location: [facility];  Service: Orthopedics;  Laterality: Right;  . KNEE ARTHROSCOPY WITH MEDIAL MENISECTOMY  05/02/2018   Procedure: KNEE ARTHROSCOPY WITH MEDIAL MENISECTOMY;  Surgeon: Bjorn PippinVarkey, Dax T, MD;  Location: [facility];  Service: Orthopedics;;  . TUBAL LIGATION    . tubal ligation reversal      There were no vitals filed for this visit.  Subjective Assessment - 10/25/18 1536    Subjective  Pt reports she was very sore after last session.  She avoids stairs if she can because she feels weak.  "I need to strengthen my knee"    Pertinent History  Rt knee ACL repair ~ 30 yrs ago; knee scope ~ 30 yrs ago    Patient Stated Goals  get better movement and "walk correctly"    Currently in Pain?  No/denies    Pain Score  0-No pain         [facility] PT Assessment - 10/25/18 0001      Assessment   Medical Diagnosis  Rt knee OA    Referring Provider (PT)  Dr Everardo PacificVarkey    Onset Date/Surgical Date  05/04/18    Hand Dominance  Right    Next MD Visit  none scheduled     Prior Therapy  here 3 visits       AROM   Right Knee Extension  -3   supine, quad set, after manual therapy      [facility] Adult PT Treatment/Exercise - 10/25/18 0001      Knee/Hip Exercises: Stretches   Passive Hamstring Stretch  Left;Right;2 reps;30 seconds    Quad Stretch  Right;3 reps;Left;2 reps;30 seconds    Gastroc Stretch  Both;3 reps;30 seconds   incline board     Knee/Hip Exercises: Aerobic   Elliptical  L1: 3 min     Other Aerobic  laps (80 ft) around gym in between exercises to decrease stiffness.       Knee/Hip Exercises: Standing   Step Down  Left;1 set;10 reps   eccentric lowering   SLS  Rt SLS forward leans to chair, progressing to floor x 10 reps     Other Standing Knee Exercises  single leg sit to stand with RLE to elevated table x 8 reps (challenging), then sit to/from stand in staggered stance, left foot forward with emphasis on RLE most of weight x 8 reps       Manual Therapy   Manual Therapy  Soft tissue mobilization;Other (comment)    Soft tissue mobilization  STM and IASTM to Rt hamstring and calf to decrease fascial restrictions     Other Manual Therapy  PNF stretching with contract relax to Rt hamstring                   PT Long Term Goals - 10/10/18 1750      PT LONG TERM GOAL #1   Title  8/6    Time  6    Period  Weeks    Status  New      PT LONG TERM GOAL #2   Title  Increase strength Rt LE to 5/5 throughout 11/21/2018    Time  6    Period  Weeks    Status  New      PT LONG TERM GOAL #3   Title  Progress patient to return to functional activities without falls or Rt LE weakness  with functional activities 11/21/2018    Time  6    Period  Weeks    Status  New      PT LONG TERM GOAL #4   Title  Independent in HEP including return to gym with appropriate exercise program 11/21/2018    Time  6    Period  Weeks    Status  New      PT LONG TERM GOAL #5   Title  Improve FOTO to </= 33% limitation 11/21/2018    Time  6    Period  Weeks    Status  New            Plan - 10/25/18 1617    Clinical Impression Statement  Pt requested not to work to strenuously in therapy due to planned mountain climbing this weekend.  Continued encouragement to comply with HEP to assist with improved strength and function in Rt knee.  Rt knee ext improved to -3 after manual therapy and stretches. tolerated exercises well, with only one episode of Rt knee pain with steps; quickly resolved with walking around gym.  Progressing towards goals.    Stability/Clinical Decision Making  Stable/Uncomplicated    Rehab Potential  Good    PT Frequency  2x / week    PT Duration  6 weeks    PT Treatment/Interventions  Patient/family education;ADLs/Self Care Home Management;Neuromuscular re-education;Therapeutic activities;Therapeutic exercise;Cryotherapy;Electrical Stimulation;Iontophoresis 4mg /ml Dexamethasone;Moist Heat;Ultrasound;Manual techniques;Dry needling;Vasopneumatic Device    PT Next Visit Plan  progress with stretching/ROM; strengthening; modalities as indicated    PT Home Exercise Plan  K ZDNYAEX       Patient will benefit from skilled therapeutic intervention in order to improve the following deficits and impairments:  Decreased strength, Decreased mobility, Decreased range of motion, Abnormal gait, Pain, Decreased activity tolerance  Visit Diagnosis: 1. Acute pain of right knee   2. Muscle weakness (generalized)   3. Other symptoms and signs involving the musculoskeletal system   4. Other abnormalities of gait and mobility        Problem List Patient Active Problem List    Diagnosis Date Noted  . Primary  osteoarthritis of right knee with Baker's cyst 01/25/2018  . S/P ACL reconstruction 01/25/2018   Mayer CamelJennifer Carlson-Long, PTA 10/25/18 4:24 PM  [facility] [address] [place], [place], [address] Phone: [phone]   Fax:  [phone]  Name: Vanessa Moss MRN: [id number] Date of Birth: Dec 25, 1970

## 2018-10-28 ENCOUNTER — Ambulatory Visit (INDEPENDENT_AMBULATORY_CARE_PROVIDER_SITE_OTHER): Payer: No Typology Code available for payment source | Admitting: Physical Therapy

## 2018-10-28 ENCOUNTER — Encounter: Payer: Self-pay | Admitting: Physical Therapy

## 2018-10-28 ENCOUNTER — Other Ambulatory Visit: Payer: Self-pay

## 2018-10-28 DIAGNOSIS — R29898 Other symptoms and signs involving the musculoskeletal system: Secondary | ICD-10-CM | POA: Diagnosis not present

## 2018-10-28 DIAGNOSIS — M6281 Muscle weakness (generalized): Secondary | ICD-10-CM | POA: Diagnosis not present

## 2018-10-28 DIAGNOSIS — M25561 Pain in right knee: Secondary | ICD-10-CM

## 2018-10-28 DIAGNOSIS — R2689 Other abnormalities of gait and mobility: Secondary | ICD-10-CM | POA: Diagnosis not present

## 2018-10-28 NOTE — Therapy (Signed)
[place], [place], [address] Phone: [phone]   Fax:  [phone]  Physical Therapy Treatment  Patient Details  Name: Vanessa Moss MRN: [id number] Date of Birth: 14-Jan-1971 Referring Provider (PT): Dr Griffin Basil   Encounter Date: 10/28/2018  PT End of Session - 10/28/18 1650    Visit Number  5    Number of Visits  12    Date for PT Re-Evaluation  11/21/18    PT Start Time  1601    PT Stop Time  1645    PT Time Calculation (min)  44 min    Activity Tolerance  Patient tolerated treatment well    Behavior During Therapy  [facility] for tasks assessed/performed       Past Medical History:  Diagnosis Date  . Anxiety   . Arthritis   . Asthma    allergy induced none in 3-4 years  . Baker's cyst, right 01/25/2018    Past Surgical History:  Procedure Laterality Date  . ANTERIOR CRUCIATE LIGAMENT REPAIR Right   . CARPAL TUNNEL RELEASE Right   . CHONDROPLASTY Right 05/02/2018   Procedure: RIGHT KNEE CHONDROPLASTY;  Surgeon: Hiram Gash, MD;  Location: [facility];  Service: Orthopedics;  Laterality: Right;  . KNEE ARTHROSCOPY WITH LATERAL MENISECTOMY Right 05/02/2018   Procedure: RIGHT KNEE ARTHROSCOPY WITH MEDIAL MENISECTOMY;  Surgeon: Hiram Gash, MD;  Location: [facility];  Service: Orthopedics;  Laterality: Right;  . KNEE ARTHROSCOPY WITH MEDIAL MENISECTOMY  05/02/2018   Procedure: KNEE ARTHROSCOPY WITH MEDIAL MENISECTOMY;  Surgeon: Hiram Gash, MD;  Location: [facility];  Service: Orthopedics;;  . TUBAL LIGATION    . tubal ligation reversal      There were no vitals filed for this visit.  Subjective Assessment - 10/28/18 1656    Subjective  Pt reports she did well with climbing this weekend, it was not as strenuous as she had planned.  She tried to use her RLE equal to LLE.  She still has occasional twinges in Rt medial knee when she turns it the "wrong  way".    Pertinent History  Rt knee ACL repair ~ 30 yrs ago; knee scope ~ 30 yrs ago    Patient Stated Goals  get better movement and "walk correctly"    Currently in Pain?  No/denies    Pain Score  0-No pain         [facility] PT Assessment - 10/28/18 0001      Assessment   Medical Diagnosis  Rt knee OA    Referring Provider (PT)  Dr Griffin Basil    Onset Date/Surgical Date  05/04/18    Hand Dominance  Right    Next MD Visit  none scheduled     Prior Therapy  here 3 visits       Flexibility   Quadriceps  Rt 128 deg; Lt 132 deg        [facility] Adult PT Treatment/Exercise - 10/28/18 0001      Knee/Hip Exercises: Stretches   Passive Hamstring Stretch  Right;4 reps;Left;2 reps;30 seconds    Passive Hamstring Stretch Limitations  standing, long sitting and supine    Quad Stretch  Right;3 reps;Left;2 reps;30 seconds    Gastroc Stretch  Both;3 reps;30 seconds   incline board     Knee/Hip Exercises: Aerobic   Recumbent Bike  L2: 6 min     Other Aerobic  laps (80 ft) around gym  in between exercises to decrease stiffness.       Knee/Hip Exercises: Standing   Forward Step Up  Right;Left;Hand Hold: 0   30 seconds onto 13" step    Forward Step Up Limitations  Lt leg able to ascend 18x, RLE only 13x.    SLS  Rt/Lt forward leans to black mat with focus on level hips x 10 each leg (Rt much more challenging)    Other Standing Knee Exercises  sit to stand with Lt foot forward, eccentric lowering x 5 reps, 2 sets      Knee/Hip Exercises: Supine   Straight Leg Raise with External Rotation  Strengthening;Right;1 set;10 reps      Manual Therapy   Manual therapy comments  2- I strips of reg rock tape applied in X pattern to Rt medial knee at joint line with 15% stretch to provide decompression and inreased proprioception to area.     Soft tissue mobilization  STM to Rt hamstring and calf                   PT Long Term Goals - 10/10/18 1750      PT LONG TERM GOAL #1   Title  8/6    Time   6    Period  Weeks    Status  New      PT LONG TERM GOAL #2   Title  Increase strength Rt LE to 5/5 throughout 11/21/2018    Time  6    Period  Weeks    Status  New      PT LONG TERM GOAL #3   Title  Progress patient to return to functional activities without falls or Rt LE weakness with functional activities 11/21/2018    Time  6    Period  Weeks    Status  New      PT LONG TERM GOAL #4   Title  Independent in HEP including return to gym with appropriate exercise program 11/21/2018    Time  6    Period  Weeks    Status  New      PT LONG TERM GOAL #5   Title  Improve FOTO to </= 33% limitation 11/21/2018    Time  6    Period  Weeks    Status  New            Plan - 10/28/18 1651    Clinical Impression Statement  Much improved Rt quad flexibility. Gait is appearing less antalgic when she focuses on good pattern.  Rt quad fatigues quickly.  Pt progressing towards goals.    Stability/Clinical Decision Making  Stable/Uncomplicated    Rehab Potential  Good    PT Frequency  2x / week    PT Duration  6 weeks    PT Treatment/Interventions  Patient/family education;ADLs/Self Care Home Management;Neuromuscular re-education;Therapeutic activities;Therapeutic exercise;Cryotherapy;Electrical Stimulation;Iontophoresis 4mg /ml Dexamethasone;Moist Heat;Ultrasound;Manual techniques;Dry needling;Vasopneumatic Device    PT Next Visit Plan  progress with stretching/ROM; strengthening; modalities as indicated    PT Home Exercise Plan  K ZDNYAEX       Patient will benefit from skilled therapeutic intervention in order to improve the following deficits and impairments:  Decreased strength, Decreased mobility, Decreased range of motion, Abnormal gait, Pain, Decreased activity tolerance  Visit Diagnosis: 1. Acute pain of right knee   2. Muscle weakness (generalized)   3. Other symptoms and signs involving the musculoskeletal system   4. Other abnormalities of gait and mobility  Problem  List Patient Active Problem List   Diagnosis Date Noted  . Primary osteoarthritis of right knee with Baker's cyst 01/25/2018  . S/P ACL reconstruction 01/25/2018   Mayer CamelJennifer Carlson-Long, PTA 10/28/18 4:59 PM   [facility] [address] [place], [place], [address] Phone: [phone]   Fax:  [phone]  Name: Vanessa EhlersJill Moss MRN: [id number] Date of Birth: July 12, 1970

## 2018-10-31 ENCOUNTER — Ambulatory Visit (INDEPENDENT_AMBULATORY_CARE_PROVIDER_SITE_OTHER): Payer: No Typology Code available for payment source | Admitting: Rehabilitative and Restorative Service Providers"

## 2018-10-31 ENCOUNTER — Encounter: Payer: Self-pay | Admitting: Rehabilitative and Restorative Service Providers"

## 2018-10-31 ENCOUNTER — Other Ambulatory Visit: Payer: Self-pay

## 2018-10-31 DIAGNOSIS — M6281 Muscle weakness (generalized): Secondary | ICD-10-CM | POA: Diagnosis not present

## 2018-10-31 DIAGNOSIS — M25561 Pain in right knee: Secondary | ICD-10-CM

## 2018-10-31 DIAGNOSIS — R2689 Other abnormalities of gait and mobility: Secondary | ICD-10-CM

## 2018-10-31 DIAGNOSIS — R29898 Other symptoms and signs involving the musculoskeletal system: Secondary | ICD-10-CM | POA: Diagnosis not present

## 2018-10-31 NOTE — Therapy (Signed)
[place], [place], [address] Phone: [phone]   Fax:  [phone]  Physical Therapy Treatment  Patient Details  Name: Vanessa Moss MRN: [id number] Date of Birth: 06-Aug-1970 Referring Provider (PT): Dr Griffin Basil   Encounter Date: 10/31/2018  PT End of Session - 10/31/18 1559    Visit Number  6    Number of Visits  12    Date for PT Re-Evaluation  11/21/18    PT Start Time  1600    PT Stop Time  1648    PT Time Calculation (min)  48 min    Activity Tolerance  Patient tolerated treatment well       Past Medical History:  Diagnosis Date  . Anxiety   . Arthritis   . Asthma    allergy induced none in 3-4 years  . Baker's cyst, right 01/25/2018    Past Surgical History:  Procedure Laterality Date  . ANTERIOR CRUCIATE LIGAMENT REPAIR Right   . CARPAL TUNNEL RELEASE Right   . CHONDROPLASTY Right 05/02/2018   Procedure: RIGHT KNEE CHONDROPLASTY;  Surgeon: Hiram Gash, MD;  Location: [facility];  Service: Orthopedics;  Laterality: Right;  . KNEE ARTHROSCOPY WITH LATERAL MENISECTOMY Right 05/02/2018   Procedure: RIGHT KNEE ARTHROSCOPY WITH MEDIAL MENISECTOMY;  Surgeon: Hiram Gash, MD;  Location: [facility];  Service: Orthopedics;  Laterality: Right;  . KNEE ARTHROSCOPY WITH MEDIAL MENISECTOMY  05/02/2018   Procedure: KNEE ARTHROSCOPY WITH MEDIAL MENISECTOMY;  Surgeon: Hiram Gash, MD;  Location: [facility];  Service: Orthopedics;;  . TUBAL LIGATION    . tubal ligation reversal      There were no vitals filed for this visit.  Subjective Assessment - 10/31/18 1600    Subjective  Doing better overall - less pain in the Rt knee. She is seeing a Psychologist, sport and exercise on Tuesday to discuss continued knee pain    Currently in Pain?  No/denies                       [facility] Adult PT Treatment/Exercise - 10/31/18 0001      Knee/Hip Exercises: Stretches   Passive Hamstring Stretch  Right;4 reps;Left;2 reps;30 seconds   supine with strap    Gastroc Stretch  Both;3 reps;30 seconds   incline board     Knee/Hip Exercises: Aerobic   Recumbent Bike  L2: 6 min     Other Aerobic  laps (80 ft) around gym in between exercises to decrease stiffness.       Knee/Hip Exercises: Standing   Lateral Step Up  Right;15 reps;Hand Hold: 1;Step Height: 4"   heel tap w/ Lt    Forward Step Up  Right;20 reps;Hand Hold: 2;Step Height: 6"   30 seconds onto 13" step    Wall Squat  10 reps;5 seconds    SLS  Rt/Lt forward leans to black mat with focus on level hips x 10 each leg    SLS with Vectors  SLS bouncing sm orange ball on wall 30-40 sec x 3 each LE     Other Standing Knee Exercises  sit to stand with Lt foot forward, eccentric lowering x 5 reps, 2 sets      Manual Therapy   Manual therapy comments  2- I strips of reg rock tape applied in X pattern to Rt medial knee at joint line with 15% stretch to provide decompression and inreased proprioception to area.  Soft tissue mobilization  STM/IASTM to Rt hamstring and calf                   PT Long Term Goals - 10/10/18 1750      PT LONG TERM GOAL #1   Title  8/6    Time  6    Period  Weeks    Status  New      PT LONG TERM GOAL #2   Title  Increase strength Rt LE to 5/5 throughout 11/21/2018    Time  6    Period  Weeks    Status  New      PT LONG TERM GOAL #3   Title  Progress patient to return to functional activities without falls or Rt LE weakness with functional activities 11/21/2018    Time  6    Period  Weeks    Status  New      PT LONG TERM GOAL #4   Title  Independent in HEP including return to gym with appropriate exercise program 11/21/2018    Time  6    Period  Weeks    Status  New      PT LONG TERM GOAL #5   Title  Improve FOTO to </= 33% limitation 11/21/2018    Time  6    Period  Weeks    Status  New            Plan - 10/31/18 1604    Clinical Impression  Statement  Positive response to strengthening and stretching as manual work posterior Rt calf to thigh. Improved gait pattern and increasing functioinal strength Rt LE. Progressing toward stated goals of therapy.    Stability/Clinical Decision Making  Stable/Uncomplicated    Rehab Potential  Good    PT Frequency  2x / week    PT Duration  6 weeks    PT Treatment/Interventions  Patient/family education;ADLs/Self Care Home Management;Neuromuscular re-education;Therapeutic activities;Therapeutic exercise;Cryotherapy;Electrical Stimulation;Iontophoresis 4mg /ml Dexamethasone;Moist Heat;Ultrasound;Manual techniques;Dry needling;Vasopneumatic Device    PT Next Visit Plan  progress with stretching/ROM; strengthening; modalities as indicated    PT Home Exercise Plan  K ZDNYAEX    Consulted and Agree with Plan of Care  Patient       Patient will benefit from skilled therapeutic intervention in order to improve the following deficits and impairments:  Decreased strength, Decreased mobility, Decreased range of motion, Abnormal gait, Pain, Decreased activity tolerance  Visit Diagnosis: 1. Acute pain of right knee   2. Muscle weakness (generalized)   3. Other symptoms and signs involving the musculoskeletal system   4. Other abnormalities of gait and mobility        Problem List Patient Active Problem List   Diagnosis Date Noted  . Primary osteoarthritis of right knee with Baker's cyst 01/25/2018  . S/P ACL reconstruction 01/25/2018    Mackie Holness Rober MinionP Zaineb Nowaczyk PT, MPH  10/31/2018, 5:52 PM  [facility] [address] [place], [place], [address] Phone: [phone]   Fax:  [phone]  Name: Barbarann EhlersJill Cash MRN: [id number] Date of Birth: Aug 19, 1970

## 2018-11-06 ENCOUNTER — Ambulatory Visit (INDEPENDENT_AMBULATORY_CARE_PROVIDER_SITE_OTHER): Payer: No Typology Code available for payment source | Admitting: Physical Therapy

## 2018-11-06 ENCOUNTER — Other Ambulatory Visit: Payer: Self-pay

## 2018-11-06 ENCOUNTER — Telehealth: Payer: Self-pay | Admitting: *Deleted

## 2018-11-06 DIAGNOSIS — M6281 Muscle weakness (generalized): Secondary | ICD-10-CM

## 2018-11-06 DIAGNOSIS — M25561 Pain in right knee: Secondary | ICD-10-CM

## 2018-11-06 DIAGNOSIS — R29898 Other symptoms and signs involving the musculoskeletal system: Secondary | ICD-10-CM

## 2018-11-06 DIAGNOSIS — R2689 Other abnormalities of gait and mobility: Secondary | ICD-10-CM | POA: Diagnosis not present

## 2018-11-06 NOTE — Therapy (Addendum)
[place], [place], [address] Phone: [phone]   Fax:  [phone]  Physical Therapy Treatment  Patient Details  Name: Jacquel Redditt MRN: [id number] Date of Birth: 09-24-1970 Referring Provider (PT): Dr Griffin Basil   Encounter Date: 11/06/2018  PT End of Session - 11/06/18 1619    Visit Number  7    Number of Visits  12    Date for PT Re-Evaluation  11/21/18    PT Start Time  1603    PT Stop Time  1645    PT Time Calculation (min)  42 min    Activity Tolerance  No increased pain    Behavior During Therapy  [facility] for tasks assessed/performed       Past Medical History:  Diagnosis Date  . Anxiety   . Arthritis   . Asthma    allergy induced none in 3-4 years  . Baker's cyst, right 01/25/2018    Past Surgical History:  Procedure Laterality Date  . ANTERIOR CRUCIATE LIGAMENT REPAIR Right   . CARPAL TUNNEL RELEASE Right   . CHONDROPLASTY Right 05/02/2018   Procedure: RIGHT KNEE CHONDROPLASTY;  Surgeon: Hiram Gash, MD;  Location: [facility];  Service: Orthopedics;  Laterality: Right;  . KNEE ARTHROSCOPY WITH LATERAL MENISECTOMY Right 05/02/2018   Procedure: RIGHT KNEE ARTHROSCOPY WITH MEDIAL MENISECTOMY;  Surgeon: Hiram Gash, MD;  Location: [facility];  Service: Orthopedics;  Laterality: Right;  . KNEE ARTHROSCOPY WITH MEDIAL MENISECTOMY  05/02/2018   Procedure: KNEE ARTHROSCOPY WITH MEDIAL MENISECTOMY;  Surgeon: Hiram Gash, MD;  Location: [facility];  Service: Orthopedics;;  . TUBAL LIGATION    . tubal ligation reversal      There were no vitals filed for this visit.  Subjective Assessment - 11/06/18 1619    Subjective  Pt reports she saw MD yesterday; he issued a brace.  She has been increasing her time on eliptical and started lifting weights.    Pertinent History  Rt knee ACL repair ~ 30 yrs ago; knee scope ~ 30 yrs ago    Patient Stated Goals   get better movement and "walk correctly"    Currently in Pain?  No/denies    Pain Score  0-No pain         [facility] PT Assessment - 11/06/18 0001      Assessment   Medical Diagnosis  Rt knee OA    Referring Provider (PT)  Dr Griffin Basil    Onset Date/Surgical Date  05/04/18    Hand Dominance  Right    Next MD Visit  none scheduled     Prior Therapy  here 3 visits       AROM   Right Knee Extension  -3    Right Knee Flexion  132      Flexibility   Hamstrings  Rt 72    Quadriceps  Rt 132 deg.          [facility] Adult PT Treatment/Exercise - 11/06/18 0001      Knee/Hip Exercises: Stretches   Passive Hamstring Stretch  Right;Left;2 reps    Sports administrator  Right;3 reps;30 seconds    Gastroc Stretch  Both;3 reps;30 seconds   incline board     Knee/Hip Exercises: Aerobic   Recumbent Bike  L2: 5 min     Other Aerobic  laps (80 ft) around gym in between exercises to decrease stiffness.  Knee/Hip Exercises: Machines for Strengthening   Cybex Knee Extension  RLE: 4 plates, BLE up, RLE down x 10 reps     Total Gym Leg Press  RLE:  7 plates x 10 reps       Knee/Hip Exercises: Standing   SLS  RLE SLS with Lt foot sliding out to front/side/back (foot on paper towel) with mini squat with RLE, 3 reps in each direction (challenging)     Other Standing Knee Exercises  sit to stand with Lt foot forward, eccentric lowering x 10 reps to low mat       Manual Therapy   Manual therapy comments  2- I strips of reg rock tape applied in X pattern to Rt medial knee at joint line with 15% stretch to provide decompression and inreased proprioception to area.     Soft tissue mobilization  STM/IASTM to Rt hamstring and calf                   PT Long Term Goals - 10/10/18 1750      PT LONG TERM GOAL #1   Title  8/6    Time  6    Period  Weeks    Status  New      PT LONG TERM GOAL #2   Title  Increase strength Rt LE to 5/5 throughout 11/21/2018    Time  6    Period  Weeks    Status  New       PT LONG TERM GOAL #3   Title  Progress patient to return to functional activities without falls or Rt LE weakness with functional activities 11/21/2018    Time  6    Period  Weeks    Status  New      PT LONG TERM GOAL #4   Title  Independent in HEP including return to gym with appropriate exercise program 11/21/2018    Time  6    Period  Weeks    Status  New      PT LONG TERM GOAL #5   Title  Improve FOTO to </= 33% limitation 11/21/2018    Time  6    Period  Weeks    Status  New            Plan - 11/06/18 1646    Clinical Impression Statement  Pt demonstrated improved ability to complete staggered stance sit to stand from low surface.  Added knee ext/ leg press (single leg) to HEP to assist in RLE catching up in strength to LLE.  Improved quad/hamstring flexibility noted.  PRogressing towards goals of therapy.    Stability/Clinical Decision Making  Stable/Uncomplicated    Rehab Potential  Good    PT Frequency  2x / week    PT Duration  6 weeks    PT Treatment/Interventions  Patient/family education;ADLs/Self Care Home Management;Neuromuscular re-education;Therapeutic activities;Therapeutic exercise;Cryotherapy;Electrical Stimulation;Iontophoresis 94m/ml Dexamethasone;Moist Heat;Ultrasound;Manual techniques;Dry needling;Vasopneumatic Device    PT Next Visit Plan  progress with stretching/ROM; strengthening; modalities as indicated    PT Home Exercise Plan  K ZDNYAEX    Consulted and Agree with Plan of Care  Patient       Patient will benefit from skilled therapeutic intervention in order to improve the following deficits and impairments:  Decreased strength, Decreased mobility, Decreased range of motion, Abnormal gait, Pain, Decreased activity tolerance  Visit Diagnosis: 1. Acute pain of right knee   2. Muscle weakness (generalized)   3. Other symptoms  and signs involving the musculoskeletal system   4. Other abnormalities of gait and mobility        Problem  List Patient Active Problem List   Diagnosis Date Noted  . Primary osteoarthritis of right knee with Baker's cyst 01/25/2018  . S/P ACL reconstruction 01/25/2018   Kerin Perna, PTA 11/06/18 4:54 PM  [place], [place], [address] Phone: [phone]   Fax:  [phone]  Name: Saesha Llerenas MRN: [id number] Date of Birth: 04-Feb-1971  PHYSICAL THERAPY DISCHARGE SUMMARY  Visits from Start of Care: 7  Current functional level related to goals / functional outcomes: See progress report for discharge status    Remaining deficits: Unknown    Education / Equipment: HEP  Plan: Patient agrees to discharge.  Patient goals were not met. Patient is being discharged due to                                                     ?????     Scheduled for injections   Celyn P. Helene Kelp PT, MPH 12/09/18 3:48 PM

## 2018-11-06 NOTE — Telephone Encounter (Signed)
She is within 6 months but forwarding note to hold and see if we can get this approved again early.  If not she will just have to pay out-of-pocket for the injections.

## 2018-11-06 NOTE — Telephone Encounter (Signed)
Pt left a vm stating that Dr. Griffin Basil suggested that she try to get Orthovisc approved again.

## 2018-11-07 NOTE — Telephone Encounter (Signed)
Patient has an appointment for the first injection set up.

## 2018-11-07 NOTE — Telephone Encounter (Signed)
I called and spoke with Vanessa Moss and [facility] and per the plan the patient has met her deductible until the next plan year. I left a brief message for the patient that she can call and get the next injections covered and to call the office for an appointment.

## 2018-11-08 ENCOUNTER — Encounter: Payer: No Typology Code available for payment source | Admitting: Physical Therapy

## 2018-11-11 ENCOUNTER — Encounter: Payer: No Typology Code available for payment source | Admitting: Physical Therapy

## 2018-11-13 ENCOUNTER — Encounter: Payer: No Typology Code available for payment source | Admitting: Rehabilitative and Restorative Service Providers"

## 2018-11-14 ENCOUNTER — Encounter: Payer: No Typology Code available for payment source | Admitting: Rehabilitative and Restorative Service Providers"

## 2018-11-18 ENCOUNTER — Encounter: Payer: No Typology Code available for payment source | Admitting: Physical Therapy

## 2018-11-20 ENCOUNTER — Other Ambulatory Visit: Payer: Self-pay

## 2018-11-20 ENCOUNTER — Encounter: Payer: No Typology Code available for payment source | Admitting: Physical Therapy

## 2018-11-20 ENCOUNTER — Ambulatory Visit (INDEPENDENT_AMBULATORY_CARE_PROVIDER_SITE_OTHER): Payer: No Typology Code available for payment source | Admitting: Sports Medicine

## 2018-11-20 DIAGNOSIS — M1711 Unilateral primary osteoarthritis, right knee: Secondary | ICD-10-CM | POA: Diagnosis not present

## 2018-11-20 NOTE — Progress Notes (Signed)
    Procedure: Real-time Ultrasound Guided aspiration/injection of right knee Device: GE Logiq E  Verbal informed consent obtained.  Time-out conducted.  Noted no overlying erythema, induration, or other signs of local infection.  Skin prepped in a sterile fashion.  Local anesthesia: Topical Ethyl chloride.  With sterile technique and under real time ultrasound guidance:  Using an 18-gauge needle aspirated approximately 8 cc of clear, straw-colored fluid, syringe switched and 30 mg/2 mL of OrthoVisc (sodium hyaluronate) in a prefilled syringe was injected easily into the knee. Completed without difficulty  Pain immediately resolved suggesting accurate placement of the medication.  Advised to call if fevers/chills, erythema, induration, drainage, or persistent bleeding.  Images permanently stored and available for review in the ultrasound unit.  Impression: Technically successful ultrasound guided injection.

## 2018-11-20 NOTE — Assessment & Plan Note (Signed)
Orthovisc No. 1 of 4, return in 1 week for #2.

## 2018-11-22 ENCOUNTER — Encounter: Payer: No Typology Code available for payment source | Admitting: Physical Therapy

## 2018-11-25 ENCOUNTER — Encounter: Payer: No Typology Code available for payment source | Admitting: Physical Therapy

## 2018-11-27 ENCOUNTER — Encounter: Payer: No Typology Code available for payment source | Admitting: Physical Therapy

## 2018-11-27 ENCOUNTER — Ambulatory Visit (INDEPENDENT_AMBULATORY_CARE_PROVIDER_SITE_OTHER): Payer: No Typology Code available for payment source | Admitting: Sports Medicine

## 2018-11-27 ENCOUNTER — Other Ambulatory Visit: Payer: Self-pay

## 2018-11-27 DIAGNOSIS — M1711 Unilateral primary osteoarthritis, right knee: Secondary | ICD-10-CM

## 2018-11-27 NOTE — Assessment & Plan Note (Signed)
Orthovisc No. 2 of 4, return in 1 week for #3

## 2018-11-27 NOTE — Progress Notes (Signed)
   Procedure: Real-time Ultrasound Guided injection of the right knee Device: GE Logiq E  Verbal informed consent obtained.  Time-out conducted.  Noted no overlying erythema, induration, or other signs of local infection.  Skin prepped in a sterile fashion.  Local anesthesia: Topical Ethyl chloride.  With sterile technique and under real time ultrasound guidance:  30 mg/2 mL of OrthoVisc (sodium hyaluronate) in a prefilled syringe was injected easily into the knee through a 22-gauge needle. Completed without difficulty  Pain immediately resolved suggesting accurate placement of the medication.  Advised to call if fevers/chills, erythema, induration, drainage, or persistent bleeding.  Images permanently stored and available for review in the ultrasound unit.  Impression: Technically successful ultrasound guided injection. 

## 2018-11-29 ENCOUNTER — Encounter: Payer: No Typology Code available for payment source | Admitting: Physical Therapy

## 2018-12-02 ENCOUNTER — Encounter: Payer: No Typology Code available for payment source | Admitting: Physical Therapy

## 2018-12-04 ENCOUNTER — Ambulatory Visit (INDEPENDENT_AMBULATORY_CARE_PROVIDER_SITE_OTHER): Payer: No Typology Code available for payment source | Admitting: Sports Medicine

## 2018-12-04 DIAGNOSIS — M1711 Unilateral primary osteoarthritis, right knee: Secondary | ICD-10-CM

## 2018-12-04 DIAGNOSIS — L600 Ingrowing nail: Secondary | ICD-10-CM | POA: Diagnosis not present

## 2018-12-04 MED ORDER — DIAZEPAM 5 MG PO TABS: ORAL_TABLET | ORAL | 0 refills | Status: DC

## 2018-12-04 NOTE — Assessment & Plan Note (Signed)
Orthovisc No. 3 of 4 into the right knee, return in 1 week for #4 of 4 

## 2018-12-04 NOTE — Progress Notes (Addendum)
Subjective:    CC: Several issues  HPI: Right knee osteoarthritis: Here for Orthovisc injection #3 of 4.  Toenail issue: In addition for years she has had issues with her right great toenail, it has been ingrown, painful.  Symptoms are moderate, persistent, localized without radiation.  I reviewed the past medical history, family history, social history, surgical history, and allergies today and no changes were needed.  Please see the problem list section below in epic for further details.  Past Medical History: Past Medical History:  Diagnosis Date  . Anxiety   . Arthritis   . Asthma    allergy induced none in 3-4 years  . Baker's cyst, right 01/25/2018   Past Surgical History: Past Surgical History:  Procedure Laterality Date  . ANTERIOR CRUCIATE LIGAMENT REPAIR Right   . CARPAL TUNNEL RELEASE Right   . CHONDROPLASTY Right 05/02/2018   Procedure: RIGHT KNEE CHONDROPLASTY;  Surgeon: Bjorn PippinVarkey, Dax T, MD;  Location: [facility];  Service: Orthopedics;  Laterality: Right;  . KNEE ARTHROSCOPY WITH LATERAL MENISECTOMY Right 05/02/2018   Procedure: RIGHT KNEE ARTHROSCOPY WITH MEDIAL MENISECTOMY;  Surgeon: Bjorn PippinVarkey, Dax T, MD;  Location: [facility];  Service: Orthopedics;  Laterality: Right;  . KNEE ARTHROSCOPY WITH MEDIAL MENISECTOMY  05/02/2018   Procedure: KNEE ARTHROSCOPY WITH MEDIAL MENISECTOMY;  Surgeon: Bjorn PippinVarkey, Dax T, MD;  Location: [facility];  Service: Orthopedics;;  . TUBAL LIGATION    . tubal ligation reversal     Social History: Social History   Socioeconomic History  . Marital status: Single    Spouse name: Not on file  . Number of children: Not on file  . Years of education: Not on file  . Highest education level: Not on file  Occupational History  . Not on file  Social Needs  . Financial resource strain: Not on file  . Food insecurity    Worry: Not on file    Inability: Not on file  . Transportation needs    Medical:  Not on file    Non-medical: Not on file  Tobacco Use  . Smoking status: Never Smoker  . Smokeless tobacco: Never Used  Substance and Sexual Activity  . Alcohol use: Yes    Comment: occas  . Drug use: Never  . Sexual activity: Yes    Birth control/protection: Surgical    Comment: partner has had vasectomy   Lifestyle  . Physical activity    Days per week: Not on file    Minutes per session: Not on file  . Stress: Not on file  Relationships  . Social Musicianconnections    Talks on phone: Not on file    Gets together: Not on file    Attends religious service: Not on file    Active member of club or organization: Not on file    Attends meetings of clubs or organizations: Not on file    Relationship status: Not on file  Other Topics Concern  . Not on file  Social History Narrative  . Not on file   Family History: Family History  Problem Relation Age of Onset  . Breast cancer Neg Hx    Allergies: Allergies  Allergen Reactions  . Penicillins Nausea Only    @ Gms Ancef IV with no obvious reaction   Medications: See med rec.  Review of Systems: No fevers, chills, night sweats, weight loss, chest pain, or shortness of breath.   Objective:    General: Well Developed, well  nourished, and in no acute distress.  Neuro: Alert and oriented x3, extra-ocular muscles intact, sensation grossly intact.  HEENT: Normocephalic, atraumatic, pupils equal round reactive to light, neck supple, no masses, no lymphadenopathy, thyroid nonpalpable.  Skin: Warm and dry, no rashes. Cardiac: Regular rate and rhythm, no murmurs rubs or gallops, no lower extremity edema.  Respiratory: Clear to auscultation bilaterally. Not using accessory muscles, speaking in full sentences. Right great toenail: Onychodystrophy, thickening, with ingrown on the medial and lateral edges of the plate.  Procedure: Real-time Ultrasound Guided injection of the right knee Device: GE Logiq E  Verbal informed consent obtained.   Time-out conducted.  Noted no overlying erythema, induration, or other signs of local infection.  Skin prepped in a sterile fashion.  Local anesthesia: Topical Ethyl chloride.  With sterile technique and under real time ultrasound guidance:  30 mg/2 mL of OrthoVisc (sodium hyaluronate) in a prefilled syringe was injected easily into the knee through a 22-gauge needle. Completed without difficulty  Pain immediately resolved suggesting accurate placement of the medication.  Advised to call if fevers/chills, erythema, induration, drainage, or persistent bleeding.  Images permanently stored and available for review in the ultrasound unit.  Impression: Technically successful ultrasound guided injection.  Impression and Recommendations:    Primary osteoarthritis of right knee with Baker's cyst Orthovisc No. 3 of 4 into the right knee, return in 1 week for #4 of 4.  Ingrown right greater toenail Return next week, we can do both Orthovisc injection and a medial and lateral nail plate excision with phenol matricectomy. Adding Valium for preprocedural anxiolysis.    ___________________________________________ Gwen Her. Dianah Field, M.D., ABFM., CAQSM. Primary Care and Sports Medicine [facility]  Adjunct Professor of [employer]

## 2018-12-04 NOTE — Addendum Note (Signed)
Addended by: Silverio Decamp on: 12/04/2018 03:51 PM   Modules accepted: Orders, Level of Service

## 2018-12-04 NOTE — Assessment & Plan Note (Signed)
Return next week, we can do both Orthovisc injection and a medial and lateral nail plate excision with phenol matricectomy. Adding Valium for preprocedural anxiolysis.

## 2018-12-06 ENCOUNTER — Encounter: Payer: No Typology Code available for payment source | Admitting: Physical Therapy

## 2018-12-10 ENCOUNTER — Ambulatory Visit (INDEPENDENT_AMBULATORY_CARE_PROVIDER_SITE_OTHER): Payer: No Typology Code available for payment source | Admitting: Sports Medicine

## 2018-12-10 DIAGNOSIS — M1711 Unilateral primary osteoarthritis, right knee: Secondary | ICD-10-CM | POA: Diagnosis not present

## 2018-12-10 DIAGNOSIS — L600 Ingrowing nail: Secondary | ICD-10-CM

## 2018-12-10 MED ORDER — HYDROCODONE-ACETAMINOPHEN 10-325 MG PO TABS: 1.0000 | ORAL_TABLET | Freq: Three times a day (TID) | ORAL | 0 refills | Status: DC | PRN

## 2018-12-10 MED ORDER — TRIAZOLAM 0.25 MG PO TABS: ORAL_TABLET | ORAL | 0 refills | Status: DC

## 2018-12-10 NOTE — Assessment & Plan Note (Signed)
Orthovisc No. 4 of 4 right knee. Return as needed.

## 2018-12-10 NOTE — Progress Notes (Signed)
    Procedure: Real-time Ultrasound Guidedinjection of the right knee Device: GE Logiq E  Verbal informed consent obtained.  Time-out conducted.  Noted no overlying erythema, induration, or other signs of local infection.  Skin prepped in a sterile fashion.  Local anesthesia: Topical Ethyl chloride.  With sterile technique and under real time ultrasound guidance:  Using an 18-gauge needle aspirated 14 cc of clear, straw-colored fluid, syringe switched and 30 mg/2 mL of OrthoVisc (sodium hyaluronate) in a prefilled syringe was injected easily into the knee. Completed without difficulty  Pain immediately resolved suggesting accurate placement of the medication.  Advised to call if fevers/chills, erythema, induration, drainage, or persistent bleeding.  Images permanently stored and available for review in the ultrasound unit.  Impression: Technically successful ultrasound guided injection.  ___________________________________________________________________________________________  Primary osteoarthritis of right knee with Baker's cyst Orthovisc No. 4 of 4 right knee. Return as needed.  Ingrown right greater toenail The plan was to do a bilateral medial and lateral nail plate excision with phenol matricectomy today. Valium was inadequately efficacious today, she is not benzo nave, takes clonazepam daily. Adding Halcion to be taken 1.5 hours before the procedure which will be rescheduled for tomorrow. Also going to go ahead and add hydrocodone for postprocedural pain to be used after the procedure tomorrow.

## 2018-12-10 NOTE — Assessment & Plan Note (Addendum)
The plan was to do a bilateral medial and lateral nail plate excision with phenol matricectomy today. Valium was inadequately efficacious today, she is not benzo nave, takes clonazepam daily. Adding Halcion to be taken 1.5 hours before the procedure which will be rescheduled for tomorrow. Also going to go ahead and add hydrocodone for postprocedural pain to be used after the procedure tomorrow.

## 2018-12-11 ENCOUNTER — Ambulatory Visit: Payer: No Typology Code available for payment source | Admitting: Sports Medicine

## 2018-12-11 ENCOUNTER — Other Ambulatory Visit: Payer: Self-pay

## 2018-12-11 DIAGNOSIS — L600 Ingrowing nail: Secondary | ICD-10-CM

## 2018-12-11 NOTE — Progress Notes (Signed)
   Procedure:  Removal of right great medial nail plate. Risks, benefits, alternatives explained to patient. Consent obtained. Time out conducted. Noted no overlying induration or erythema at site of injection. Toe cleaned with alcohol, then a total of 4cc lidocaine 1% infiltrated at adjacent webspaces at the location of the bifurcation of the common digital nerve to proper digital nerves.  Adequate anesthesia ensured. Toe prepped and draped in a sterile fashion. Nail elevator used to separate nail plate from nail bed. Clippers used to cut toenail in a longitudinal fashion to proximal nail fold and matrix. Hemostat then used to separate nail fragment from surrounding structures. Nail bed and matrix treated. Minor bleeding controlled with pressure and phenol. Antibiotic ointment applied. Toe dressed. Advised to return if increased redness, swelling, drainage, fevers, or chills.  Procedure:  Removal of right great lateral nail plate. Risks, benefits, alternatives explained to patient. Consent obtained. Time out conducted. Noted no overlying induration or erythema at site of injection. Toe cleaned with alcohol, then a total of 4cc lidocaine 1% infiltrated at adjacent webspaces at the location of the bifurcation of the common digital nerve to proper digital nerves.  Adequate anesthesia ensured. Toe prepped and draped in a sterile fashion. Nail elevator used to separate nail plate from nail bed. Clippers used to cut toenail in a longitudinal fashion to proximal nail fold and matrix. Hemostat then used to separate nail fragment from surrounding structures. Nail bed and matrix treated. Minor bleeding controlled with pressure and phenol. Antibiotic ointment applied. Toe dressed. Advised to return if increased redness, swelling, drainage, fevers, or chills.  Procedure:  Removal of left great medial nail plate. Risks, benefits, alternatives explained to patient. Consent obtained. Time out  conducted. Noted no overlying induration or erythema at site of injection. Toe cleaned with alcohol, then a total of 4cc lidocaine 1% infiltrated at adjacent webspaces at the location of the bifurcation of the common digital nerve to proper digital nerves.  Adequate anesthesia ensured. Toe prepped and draped in a sterile fashion. Nail elevator used to separate nail plate from nail bed. Clippers used to cut toenail in a longitudinal fashion to proximal nail fold and matrix. Hemostat then used to separate nail fragment from surrounding structures. Nail bed and matrix treated. Minor bleeding controlled with pressure and phenol. Antibiotic ointment applied. Toe dressed. Advised to return if increased redness, swelling, drainage, fevers, or chills.  Procedure:  Removal of left great lateral nail plate. Risks, benefits, alternatives explained to patient. Consent obtained. Time out conducted. Noted no overlying induration or erythema at site of injection. Toe cleaned with alcohol, then a total of 4cc lidocaine 1% infiltrated at adjacent webspaces at the location of the bifurcation of the common digital nerve to proper digital nerves.  Adequate anesthesia ensured. Toe prepped and draped in a sterile fashion. Nail elevator used to separate nail plate from nail bed. Clippers used to cut toenail in a longitudinal fashion to proximal nail fold and matrix. Hemostat then used to separate nail fragment from surrounding structures. Nail bed and matrix treated. Minor bleeding controlled with pressure and phenol. Antibiotic ointment applied. Toe dressed. Advised to return if increased redness, swelling, drainage, fevers, or chills.

## 2018-12-11 NOTE — Assessment & Plan Note (Signed)
I ended up removing bilateral medial and lateral nail plates with phenol matricectomy of the nailbed. We called in hydrocodone yesterday. I gave her some extra clonazepam 0.5 mg oral dissolving today. Return to see me in 2 weeks for a wound check.

## 2018-12-17 ENCOUNTER — Telehealth: Payer: Self-pay | Admitting: *Deleted

## 2018-12-17 MED ORDER — DOXYCYCLINE HYCLATE 100 MG PO TABS: 100.0000 mg | ORAL_TABLET | Freq: Two times a day (BID) | ORAL | 0 refills | Status: AC

## 2018-12-17 NOTE — Telephone Encounter (Signed)
Sending in doxycycline

## 2018-12-17 NOTE — Telephone Encounter (Signed)
Spoke with pt and advised, per Dr. Darene Lamer, that amoxicillin was not the correct treatment and that he is sending in Doxycycline instead.

## 2018-12-17 NOTE — Telephone Encounter (Signed)
Have her upload a picture of it.  Its likely just inflamed.  Rob can text me a picture of it if needs to.

## 2018-12-17 NOTE — Telephone Encounter (Signed)
Pt left vm this morning stating that her right toe is getting infected and Rob said she needs Amoxicillin sent in.

## 2018-12-25 ENCOUNTER — Other Ambulatory Visit: Payer: Self-pay

## 2018-12-25 ENCOUNTER — Encounter: Payer: Self-pay | Admitting: Sports Medicine

## 2018-12-25 ENCOUNTER — Ambulatory Visit (INDEPENDENT_AMBULATORY_CARE_PROVIDER_SITE_OTHER): Payer: No Typology Code available for payment source | Admitting: Sports Medicine

## 2018-12-25 DIAGNOSIS — L600 Ingrowing nail: Secondary | ICD-10-CM

## 2018-12-25 NOTE — Progress Notes (Signed)
  Subjective: 2 weeks post excision of medial and lateral nail plates of the right and left great toenails.  Doing well.  Objective: General: Well-developed, well-nourished, and in no acute distress. Toes: There is a small amount of eschar, serous drainage, but no evidence of infection.  Things are looking good.  Assessment/plan:   Ingrown right greater toenail I performed a bilateral medial and lateral nail plate excisions approximately 2 weeks ago, she is doing well.    ___________________________________________ Gwen Her. Dianah Field, M.D., ABFM., CAQSM. Primary Care and Sports Medicine [facility]  Adjunct Professor of [place] [employer]

## 2018-12-25 NOTE — Assessment & Plan Note (Signed)
I performed a bilateral medial and lateral nail plate excisions approximately 2 weeks ago, she is doing well.

## 2019-05-02 ENCOUNTER — Ambulatory Visit (INDEPENDENT_AMBULATORY_CARE_PROVIDER_SITE_OTHER): Payer: BC Managed Care – PPO | Admitting: Sports Medicine

## 2019-05-02 ENCOUNTER — Other Ambulatory Visit: Payer: Self-pay

## 2019-05-02 ENCOUNTER — Telehealth: Payer: Self-pay

## 2019-05-02 DIAGNOSIS — M1711 Unilateral primary osteoarthritis, right knee: Secondary | ICD-10-CM

## 2019-05-02 MED ORDER — ETODOLAC ER 600 MG PO TB24: 600.0000 mg | ORAL_TABLET | Freq: Every day | ORAL | 3 refills | Status: DC

## 2019-05-02 MED ORDER — TRAMADOL HCL 50 MG PO TABS: 50.0000 mg | ORAL_TABLET | Freq: Three times a day (TID) | ORAL | 0 refills | Status: AC | PRN

## 2019-05-02 NOTE — Telephone Encounter (Signed)
We can switch to etodolac, she needs to discontinue Celebrex and diclofenac.

## 2019-05-02 NOTE — Assessment & Plan Note (Signed)
Vanessa Moss returns, she is a pleasant 49 year old female, she is post Orthovisc about 5 months ago. She did well, unfortunately she is having a recurrence of pain. She has seen Dr. Everardo Pacific, it is recommended that she have an arthroplasty. She would like to try another course of Orthovisc, her arthritis is x-ray confirmed, good response previously albeit 5 months ago, I would like old and to go ahead and work on approval, we are going to do tramadol in the meantime. Return to start Orthovisc when she is ready, she does tell me that even if it is not approved she is willing to start it and pad a pocket.

## 2019-05-02 NOTE — Telephone Encounter (Addendum)
Pt left a vm msg stating she was having trouble sending a msg to provider. As per pt, the anti-inflammatory rxs she has on hand are: celecoxib 200 mg (bid) and diclofenac sodium 25 mg (bid). Does provider want to send in a different rx to [facility]? Sending to provider for review.

## 2019-05-02 NOTE — Progress Notes (Signed)
    Procedures performed today:    None.  Independent interpretation of tests performed by another provider:   Personally reviewed x-rays and MRI, she has osteoarthritis.  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 She did well, unfortunately she is having a recurrence of pain. She has seen Dr. Everardo Pacific, it is recommended that she have an arthroplasty. She would like to try another course of Orthovisc, her arthritis is x-ray confirmed, good response previously albeit 5 months ago, I would like old and to go ahead and work on approval, we are going to do tramadol in the meantime. Return to start Orthovisc when she is ready, she does tell me that even if it is not approved she is willing to start it and pad a pocket.    ___________________________________________ Ihor Austin. Benjamin Stain, M.D., ABFM., CAQSM. Primary Care and Sports Medicine [facility]  Adjunct Instructor of Family Medicine  [employer]

## 2019-05-05 NOTE — Telephone Encounter (Signed)
Left a detailed vm msg for pt regarding etodolac rx. Pt aware to discontinue both celebrex and diclofenac rxs. Direct call back info provided.

## 2019-05-06 ENCOUNTER — Other Ambulatory Visit: Payer: Self-pay | Admitting: Family Medicine

## 2019-05-06 DIAGNOSIS — Z1231 Encounter for screening mammogram for malignant neoplasm of breast: Secondary | ICD-10-CM

## 2019-06-09 DIAGNOSIS — F411 Generalized anxiety disorder: Secondary | ICD-10-CM | POA: Diagnosis not present

## 2019-06-12 ENCOUNTER — Ambulatory Visit: Payer: No Typology Code available for payment source

## 2019-08-08 ENCOUNTER — Ambulatory Visit: Payer: BC Managed Care – PPO

## 2019-08-26 ENCOUNTER — Ambulatory Visit
Admission: RE | Admit: 2019-08-26 | Discharge: 2019-08-26 | Disposition: A | Discharge status: Home / Self Care | Payer: BC Managed Care – PPO | Source: Ambulatory Visit | Attending: Family Medicine | Admitting: Family Medicine

## 2019-08-26 ENCOUNTER — Other Ambulatory Visit: Payer: Self-pay

## 2019-08-26 DIAGNOSIS — Z1231 Encounter for screening mammogram for malignant neoplasm of breast: Secondary | ICD-10-CM | POA: Diagnosis not present

## 2019-09-01 DIAGNOSIS — Z6821 Body mass index (BMI) 21.0-21.9, adult: Secondary | ICD-10-CM | POA: Diagnosis not present

## 2019-09-01 DIAGNOSIS — Z01419 Encounter for gynecological examination (general) (routine) without abnormal findings: Secondary | ICD-10-CM | POA: Diagnosis not present

## 2019-11-24 ENCOUNTER — Other Ambulatory Visit: Payer: Self-pay | Admitting: Sports Medicine

## 2019-11-25 DIAGNOSIS — L820 Inflamed seborrheic keratosis: Secondary | ICD-10-CM | POA: Diagnosis not present

## 2019-11-25 DIAGNOSIS — L218 Other seborrheic dermatitis: Secondary | ICD-10-CM | POA: Diagnosis not present

## 2019-11-25 DIAGNOSIS — D1801 Hemangioma of skin and subcutaneous tissue: Secondary | ICD-10-CM | POA: Diagnosis not present

## 2019-11-25 DIAGNOSIS — Z85828 Personal history of other malignant neoplasm of skin: Secondary | ICD-10-CM | POA: Diagnosis not present

## 2019-11-25 DIAGNOSIS — L578 Other skin changes due to chronic exposure to nonionizing radiation: Secondary | ICD-10-CM | POA: Diagnosis not present

## 2019-12-17 ENCOUNTER — Telehealth: Payer: Self-pay

## 2019-12-17 NOTE — Telephone Encounter (Signed)
Lets go ahead and just get started with the insurance approval, I am going to route this to Sunday Shams she has right knee osteoarthritis, failed steroid injections, has done well with viscosupplementation in the past, Orthovisc approval please.

## 2019-12-17 NOTE — Telephone Encounter (Signed)
Pt states she has better insurance now and is wanting to know about getting Orthovisc injections. Do you want to see her first or just start with the insurance process for the injections?

## 2019-12-18 NOTE — Telephone Encounter (Signed)
Pt aware that Arline Asp is going to start the insurance process for Orthovisc

## 2019-12-24 NOTE — Telephone Encounter (Signed)
Patient emailed me insurance information and I started Orthovisc approval waiting on BID to see if it requires PA. - CF

## 2019-12-24 NOTE — Telephone Encounter (Signed)
We do not have current insurance for patient as soon as I get her updated insurance I will run the Orthovisc approval - CF

## 2019-12-30 NOTE — Telephone Encounter (Signed)
OrthoVisc requires Prior Authorization.  Tried several times to do it through Cover My Meds but it would recognize Vanessa Moss in the system. [employer] has her listed as Vanessa Moss not [employer].   I called [employer] and rep tried without success.  She has faxed the paperwork to me, I have filled it out and faxed to [facility] at [phone].  Awaiting response from [facility] now.

## 2020-01-22 NOTE — Telephone Encounter (Signed)
Orthovisc was denied but I did get Gelsyn approved. Dr. Karie Schwalbe was going to send medication in and as soon as we received it patient will be scheduled for injections. - CF

## 2020-03-04 DIAGNOSIS — F5101 Primary insomnia: Secondary | ICD-10-CM | POA: Diagnosis not present

## 2020-03-04 DIAGNOSIS — F411 Generalized anxiety disorder: Secondary | ICD-10-CM | POA: Diagnosis not present

## 2020-03-04 DIAGNOSIS — F988 Other specified behavioral and emotional disorders with onset usually occurring in childhood and adolescence: Secondary | ICD-10-CM | POA: Diagnosis not present

## 2020-09-07 ENCOUNTER — Other Ambulatory Visit: Payer: Self-pay | Admitting: Sports Medicine

## 2020-09-27 ENCOUNTER — Other Ambulatory Visit: Payer: Self-pay | Admitting: Obstetrics and Gynecology

## 2020-09-27 DIAGNOSIS — Z1231 Encounter for screening mammogram for malignant neoplasm of breast: Secondary | ICD-10-CM

## 2020-10-04 ENCOUNTER — Telehealth: Payer: Self-pay

## 2020-10-04 DIAGNOSIS — M1711 Unilateral primary osteoarthritis, right knee: Secondary | ICD-10-CM

## 2020-10-04 NOTE — Telephone Encounter (Signed)
Patient hasn't been seen in over a year. Please call her and get updated insurance information so Orthovisc authorization can be done.

## 2020-10-04 NOTE — Telephone Encounter (Signed)
Patient stated she would email me her updated insurance card so just awaiting that information. AM

## 2020-10-04 NOTE — Telephone Encounter (Signed)
Patient left a msg stating she is ready for another round of orthovisc in her right knee. She doesn't know if she needs new xrays, an OV, etc. Patient was last seen 05/02/2019. Please advise.

## 2020-10-04 NOTE — Telephone Encounter (Signed)
Patient's updated insurance card has been scanned in and entered into chart.  Patient also had a new photo ID she sent as well with a legal last name change so changed in system too.  AM

## 2020-10-04 NOTE — Telephone Encounter (Signed)
We will need updated x-rays and I will go ahead and send a message to [facility] as well to start Orthovisc.  Orders placed for x-rays, she needs to get these done ASAP.

## 2020-10-06 ENCOUNTER — Ambulatory Visit (INDEPENDENT_AMBULATORY_CARE_PROVIDER_SITE_OTHER): Payer: BLUE CROSS/BLUE SHIELD

## 2020-10-06 ENCOUNTER — Other Ambulatory Visit: Payer: Self-pay

## 2020-10-06 DIAGNOSIS — M1711 Unilateral primary osteoarthritis, right knee: Secondary | ICD-10-CM | POA: Diagnosis not present

## 2020-10-06 DIAGNOSIS — Z09 Encounter for follow-up examination after completed treatment for conditions other than malignant neoplasm: Secondary | ICD-10-CM

## 2020-10-27 NOTE — Telephone Encounter (Signed)
Submitted through Orthovisc.  Waiting on BID and prior authorization.

## 2020-10-28 ENCOUNTER — Encounter: Payer: Self-pay | Admitting: *Deleted

## 2020-10-28 DIAGNOSIS — M1711 Unilateral primary osteoarthritis, right knee: Secondary | ICD-10-CM

## 2020-11-05 NOTE — Telephone Encounter (Signed)
Faxed Orthovisc prior auth forms to [employer].

## 2020-11-08 NOTE — Telephone Encounter (Signed)
Thank you for being professional with her, Hospital doctor.  If she says okay I am assuming that I am sending her syringes to [facility]?  What is her specialty pharmacy?

## 2020-11-10 MED ORDER — SYNVISC 16 MG/2ML IX SOSY: PREFILLED_SYRINGE | INTRA_ARTICULAR | 0 refills | Status: DC

## 2020-11-10 NOTE — Addendum Note (Signed)
Addended by: Monica Becton on: 11/10/2020 03:36 PM   Modules accepted: Orders

## 2020-11-11 MED ORDER — SYNVISC 16 MG/2ML IX SOSY: PREFILLED_SYRINGE | INTRA_ARTICULAR | 0 refills | Status: DC

## 2020-11-11 NOTE — Addendum Note (Signed)
Addended by: Monica Becton on: 11/11/2020 08:38 AM   Modules accepted: Orders

## 2020-11-22 ENCOUNTER — Other Ambulatory Visit: Payer: Self-pay

## 2020-11-22 ENCOUNTER — Ambulatory Visit
Admission: RE | Admit: 2020-11-22 | Discharge: 2020-11-22 | Disposition: A | Discharge status: Home / Self Care | Payer: BLUE CROSS/BLUE SHIELD | Source: Ambulatory Visit | Attending: Obstetrics and Gynecology | Admitting: Obstetrics and Gynecology

## 2020-11-22 DIAGNOSIS — Z1231 Encounter for screening mammogram for malignant neoplasm of breast: Secondary | ICD-10-CM

## 2020-11-23 NOTE — Telephone Encounter (Signed)
Ins denied Orthovisc.  Synvisc, Durolane, GelSyn-3 are preferred.  Provider notified and GelSyn-3 sent to [facility] specialty pharmacy.  Pt aware.

## 2020-12-07 MED ORDER — SYNVISC 16 MG/2ML IX SOSY: PREFILLED_SYRINGE | INTRA_ARTICULAR | 0 refills | Status: DC

## 2020-12-07 NOTE — Addendum Note (Signed)
Addended by: Monica Becton on: 12/07/2020 04:54 PM   Modules accepted: Orders

## 2020-12-10 ENCOUNTER — Other Ambulatory Visit: Payer: Self-pay

## 2020-12-10 DIAGNOSIS — M1711 Unilateral primary osteoarthritis, right knee: Secondary | ICD-10-CM

## 2020-12-10 MED ORDER — SYNVISC 16 MG/2ML IX SOSY: PREFILLED_SYRINGE | INTRA_ARTICULAR | 0 refills | Status: DC

## 2021-05-05 ENCOUNTER — Other Ambulatory Visit: Payer: Self-pay

## 2021-05-05 DIAGNOSIS — M1711 Unilateral primary osteoarthritis, right knee: Secondary | ICD-10-CM

## 2021-05-05 MED ORDER — SYNVISC 16 MG/2ML IX SOSY: PREFILLED_SYRINGE | INTRA_ARTICULAR | 0 refills | Status: DC

## 2021-05-12 ENCOUNTER — Other Ambulatory Visit: Payer: Self-pay

## 2021-05-12 DIAGNOSIS — M1711 Unilateral primary osteoarthritis, right knee: Secondary | ICD-10-CM

## 2021-05-12 MED ORDER — SYNVISC 16 MG/2ML IX SOSY: PREFILLED_SYRINGE | INTRA_ARTICULAR | 0 refills | Status: AC

## 2021-05-25 ENCOUNTER — Telehealth: Payer: Self-pay

## 2021-05-25 NOTE — Telephone Encounter (Signed)
Spoke with patient regarding Synvisc approval. She has talked with the pharmacy and will be responsible for $1000 per injection. She is not sure it is worth the money since the second round of orthovisc didn't really do much good. This would be her 3rd round. She wants your opinion on whether she should just wait until she is in worse pain and do the replacement. She states that she has been making due lately.

## 2021-05-26 NOTE — Telephone Encounter (Signed)
The decision to proceed with knee arthroplasty never comes from the physician, it always comes from the patient.  If her discomfort reaches the level where she cannot function and do the activities that she would like to do then its time for knee replacement.

## 2021-05-27 NOTE — Telephone Encounter (Signed)
Task completed. Left a detailed vm msg regarding provider's recommendation. Patient informed if any other questions, to send a MyChart msg. Direct call back info provided.

## 2021-06-01 NOTE — Telephone Encounter (Signed)
Synvisc rec'd in office. Attempted call to patient, VM full.

## 2021-06-20 NOTE — Telephone Encounter (Signed)
Pt responded via MyChart message on 06/04/2021.  This encounter closed.  Tiajuana Amass, CMA ?

## 2021-08-04 NOTE — Telephone Encounter (Signed)
Call today from [facility] stating patient did not consent for medication to be sent to our office and it needed to be returned. The pharmacy would be sending [employer] to be picking up medication tomorrow or Monday.  ?

## 2021-10-05 ENCOUNTER — Telehealth: Payer: Self-pay | Admitting: Sports Medicine

## 2021-10-05 NOTE — Telephone Encounter (Signed)
Spoke with pt on 10/03/21 regarding charge for service never received by the insurance provider.  On 10/05/21 spoke with Irving Burton from [facility] at [phone]. According to their records, the charges have been reversed as of 08/19/21. Irving Burton will contact Maha to let her know as well.

## 2021-11-29 ENCOUNTER — Other Ambulatory Visit: Payer: Self-pay | Admitting: Obstetrics and Gynecology

## 2021-11-29 DIAGNOSIS — Z1231 Encounter for screening mammogram for malignant neoplasm of breast: Secondary | ICD-10-CM

## 2021-12-13 ENCOUNTER — Ambulatory Visit
Admission: RE | Admit: 2021-12-13 | Discharge: 2021-12-13 | Disposition: A | Discharge status: Home / Self Care | Payer: BC Managed Care – PPO | Source: Ambulatory Visit | Attending: Obstetrics and Gynecology | Admitting: Obstetrics and Gynecology

## 2021-12-13 DIAGNOSIS — Z1231 Encounter for screening mammogram for malignant neoplasm of breast: Secondary | ICD-10-CM

## 2022-08-22 IMAGING — MG MM DIGITAL SCREENING BILAT W/ TOMO AND CAD
8 series · 9 of 24 positions shown · non-contrast
Comparison: Previous exam(s).

CLINICAL DATA: Screening.

EXAM:
DIGITAL SCREENING BILATERAL MAMMOGRAM WITH TOMOSYNTHESIS AND CAD
TECHNIQUE: Bilateral screening digital craniocaudal and mediolateral oblique
mammograms were obtained. Bilateral screening digital breast
tomosynthesis was performed. The images were evaluated with
computer-aided detection.

[R CC synth-2D]
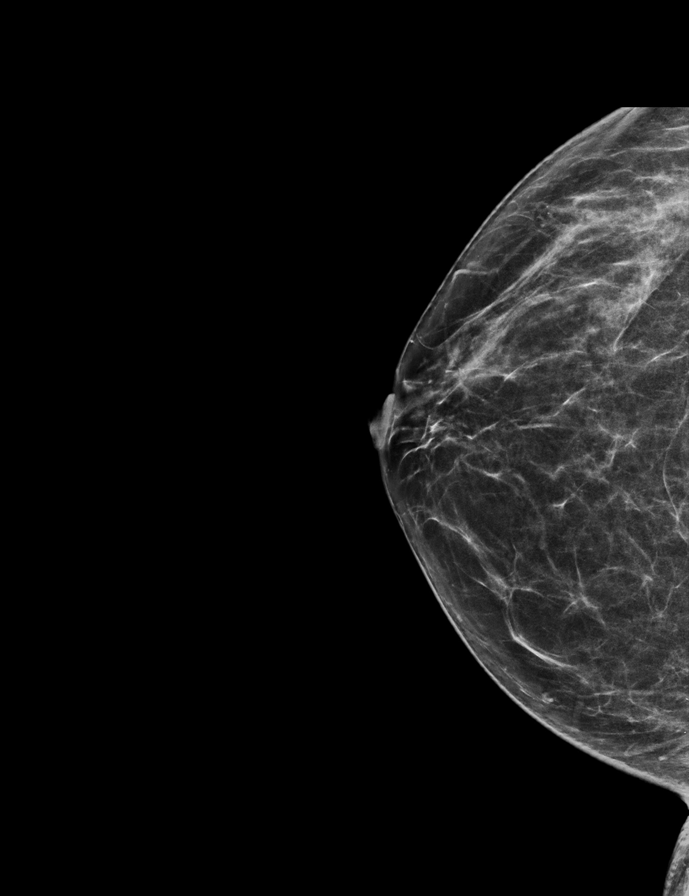

[L MLO synth-2D]
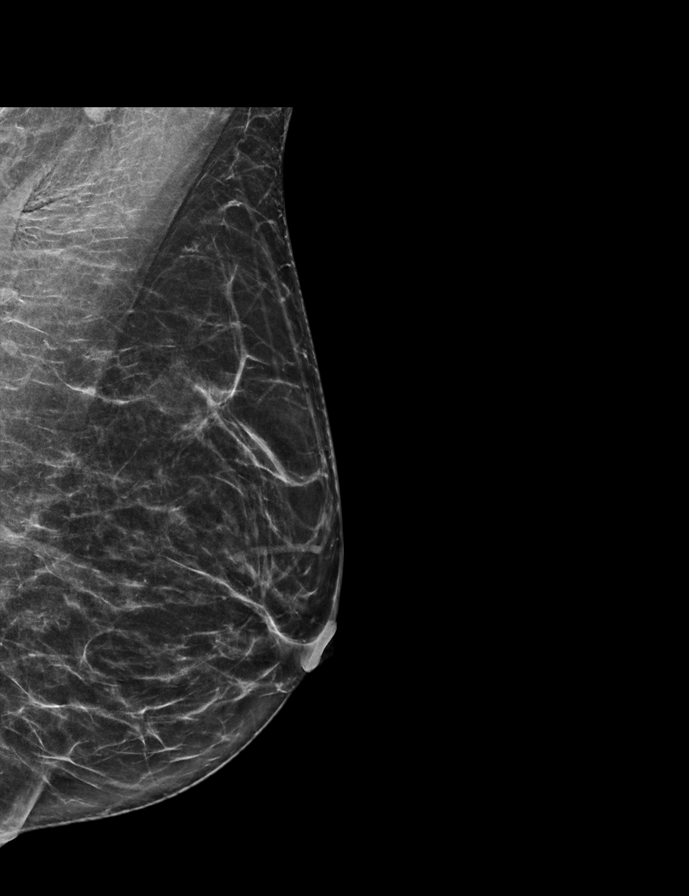

[R MLO synth-2D]
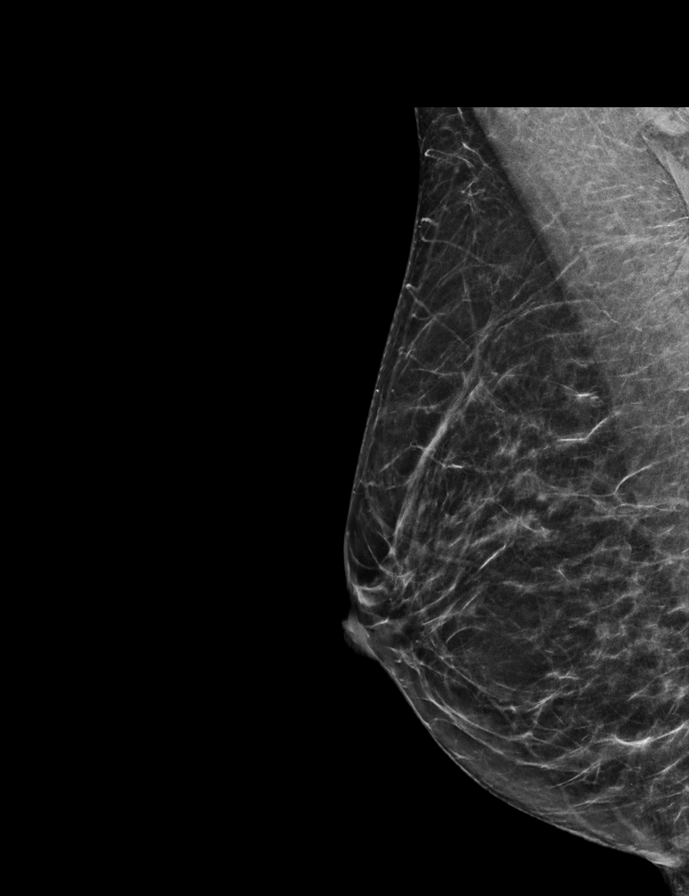

[L CC synth-2D]
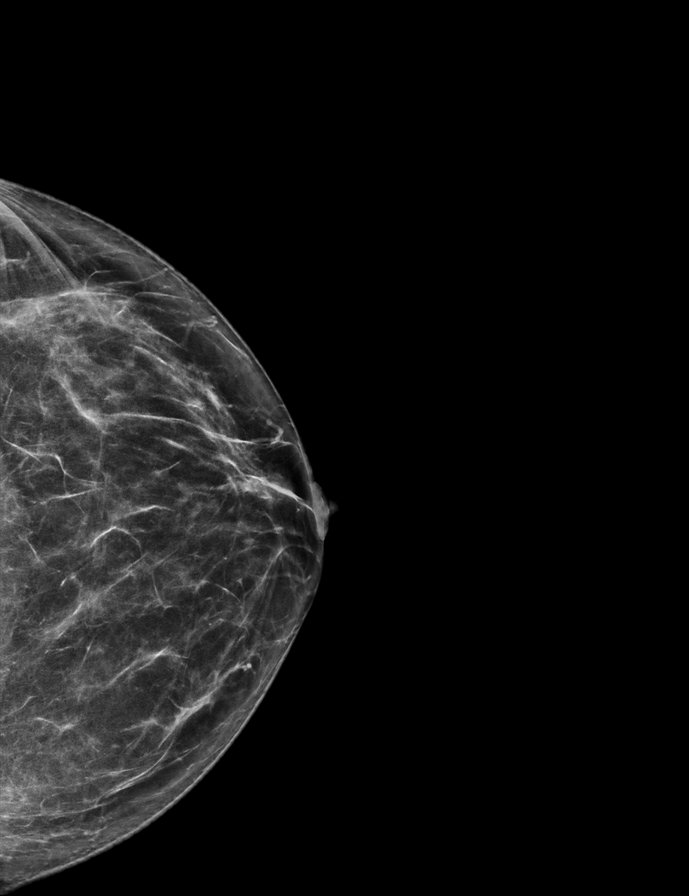

[L MLO tomo · 2 of 61 frames shown]
[frame 20/61]
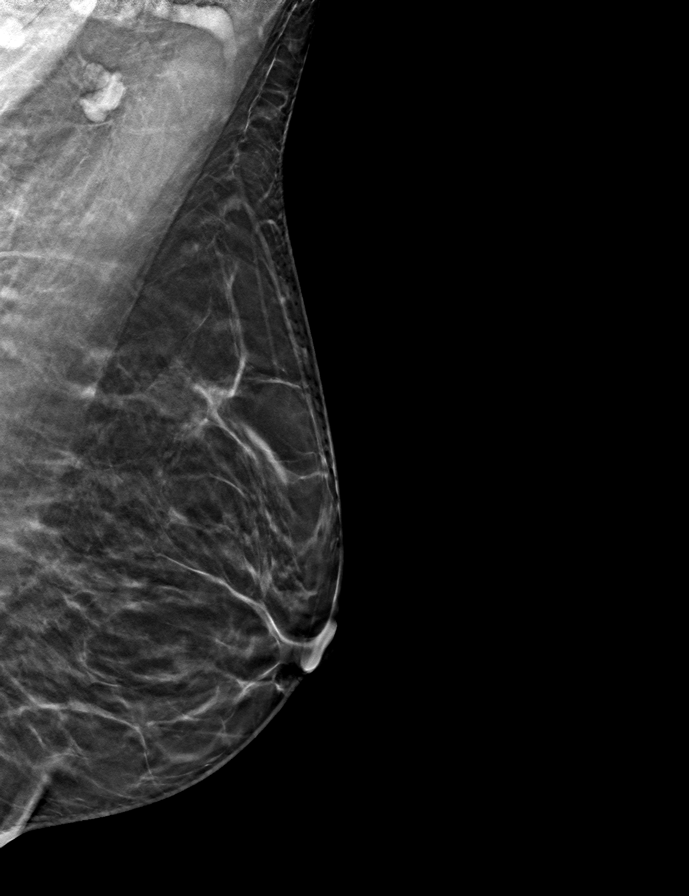
[frame 31/61]
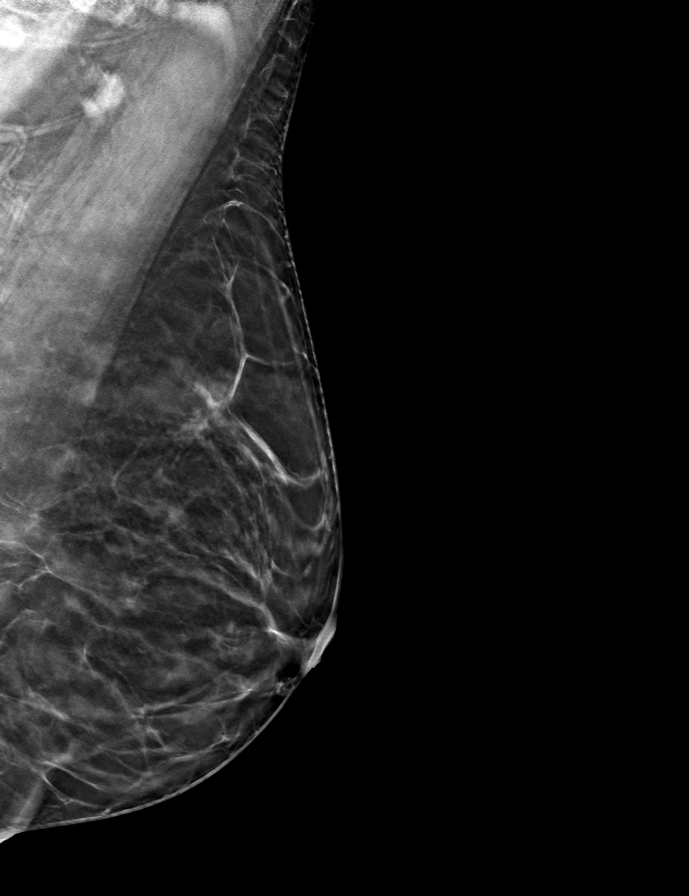

[R MLO tomo · tomo slice 29/57.0]
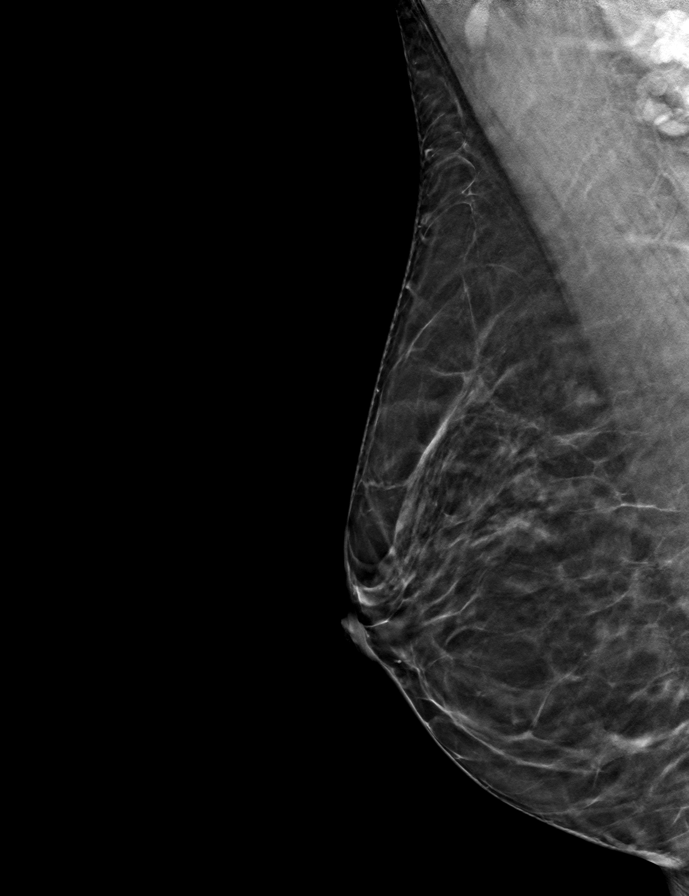

[L CC tomo · tomo slice 31/62.0]
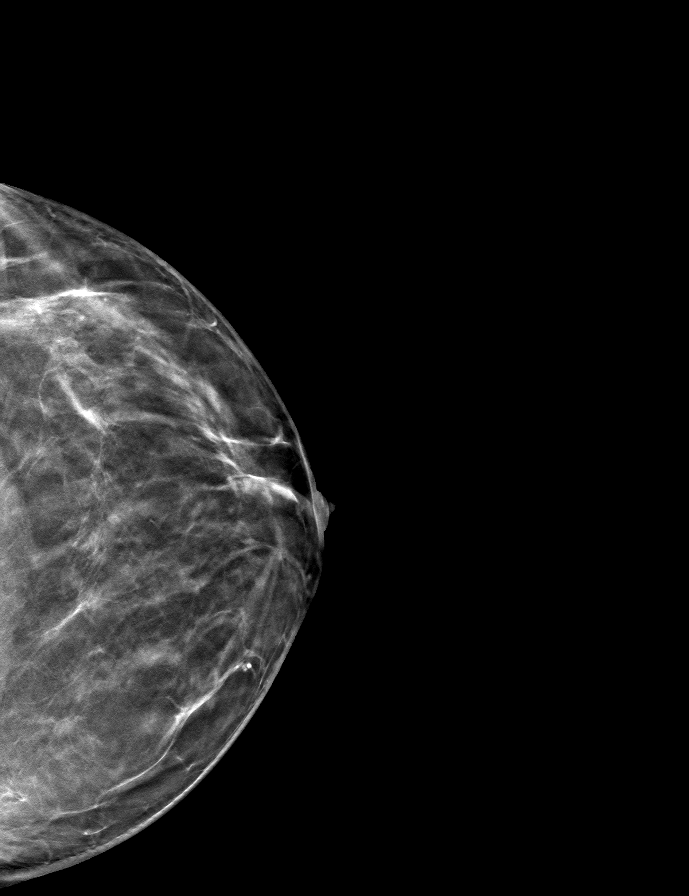

[R CC tomo · tomo slice 29/58.0]
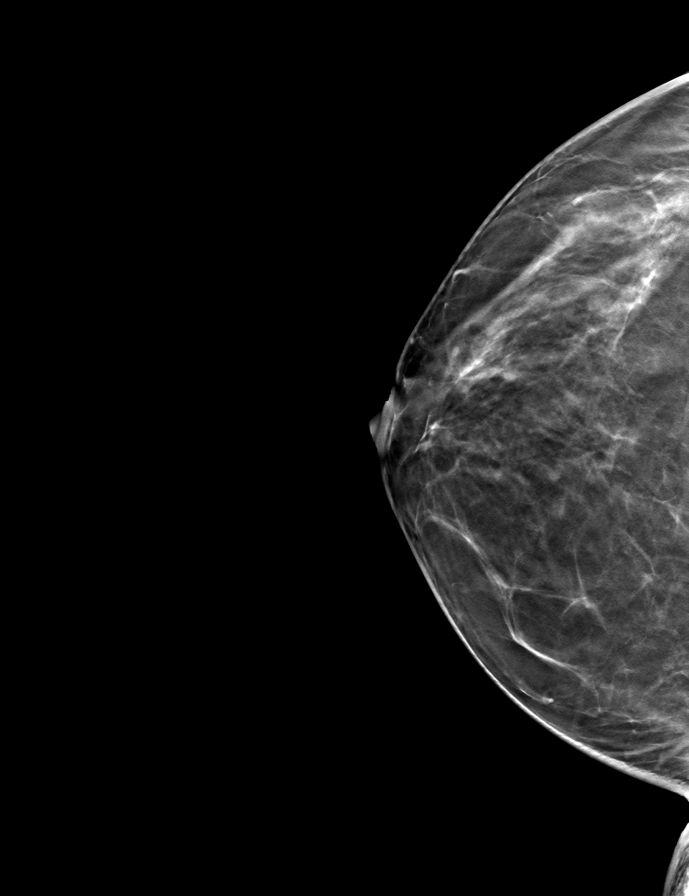

[9 of 24 positions shown; findings below may reference images not displayed]

ACR Breast Density Category b: There are scattered areas of
fibroglandular density.
FINDINGS: There are no findings suspicious for malignancy.
IMPRESSION: No mammographic evidence of malignancy. A result letter of this
screening mammogram will be mailed directly to the patient.

RECOMMENDATION:
Screening mammogram in one year. (Code:51-O-LD2)

BI-RADS CATEGORY  1: Negative.

## 2022-10-03 ENCOUNTER — Other Ambulatory Visit: Payer: Self-pay | Admitting: Obstetrics and Gynecology

## 2022-10-03 DIAGNOSIS — Z1231 Encounter for screening mammogram for malignant neoplasm of breast: Secondary | ICD-10-CM

## 2023-01-04 ENCOUNTER — Ambulatory Visit
Admission: RE | Admit: 2023-01-04 | Discharge: 2023-01-04 | Disposition: A | Discharge status: Home / Self Care | Payer: BC Managed Care – PPO | Source: Ambulatory Visit | Attending: Obstetrics and Gynecology | Admitting: Obstetrics and Gynecology

## 2023-01-04 DIAGNOSIS — Z1231 Encounter for screening mammogram for malignant neoplasm of breast: Secondary | ICD-10-CM

## 2023-12-18 ENCOUNTER — Encounter: Payer: Self-pay | Admitting: Sports Medicine

## 2024-02-22 ENCOUNTER — Other Ambulatory Visit: Payer: Self-pay | Admitting: Obstetrics and Gynecology

## 2024-02-22 DIAGNOSIS — Z1231 Encounter for screening mammogram for malignant neoplasm of breast: Secondary | ICD-10-CM

## 2024-03-25 ENCOUNTER — Ambulatory Visit
Admission: RE | Admit: 2024-03-25 | Discharge: 2024-03-25 | Disposition: A | Source: Ambulatory Visit | Attending: Obstetrics and Gynecology | Admitting: Obstetrics and Gynecology

## 2024-03-25 DIAGNOSIS — Z1231 Encounter for screening mammogram for malignant neoplasm of breast: Secondary | ICD-10-CM

## 2024-04-28 NOTE — H&P (Signed)
 PREOPERATIVE H&P  Chief Complaint: ANKYLOSIS RIGHT KNEE  HPI: Vanessa Moss is a 54 y.o. female who presents with a diagnosis of ANKYLOSIS RIGHT KNEE. Symptoms are rated as moderate to severe, and have been worsening.  This is significantly impairing activities of daily living.  She has elected for surgical management.   Past Medical History:  Diagnosis Date   Anxiety    Arthritis    Asthma    allergy induced none in 3-4 years   Baker's cyst, right 01/25/2018   Insomnia    Motion sickness    Past Surgical History:  Procedure Laterality Date   ANTERIOR CRUCIATE LIGAMENT REPAIR Right    CARPAL TUNNEL RELEASE Right    CHONDROPLASTY Right 05/02/2018   Procedure: RIGHT KNEE CHONDROPLASTY;  Surgeon: Cristy Bonner DASEN, MD;  Location: [facility];  Service: Orthopedics;  Laterality: Right;   KNEE ARTHROSCOPY WITH LATERAL MENISECTOMY Right 05/02/2018   Procedure: RIGHT KNEE ARTHROSCOPY WITH MEDIAL MENISECTOMY;  Surgeon: Cristy Bonner DASEN, MD;  Location: [facility];  Service: Orthopedics;  Laterality: Right;   KNEE ARTHROSCOPY WITH MEDIAL MENISECTOMY  05/02/2018   Procedure: KNEE ARTHROSCOPY WITH MEDIAL MENISECTOMY;  Surgeon: Cristy Bonner DASEN, MD;  Location: [facility];  Service: Orthopedics;;   TUBAL LIGATION     tubal ligation reversal     Social History   Socioeconomic History   Marital status: Single    Spouse name: Not on file   Number of children: Not on file   Years of education: Not on file   Highest education level: Not on file  Occupational History   Not on file  Tobacco Use   Smoking status: Never   Smokeless tobacco: Never  Vaping Use   Vaping status: Never Used  Substance and Sexual Activity   Alcohol use: Yes    Comment: occas   Drug use: Never   Sexual activity: Yes    Birth control/protection: Surgical    Comment: partner has had vasectomy   Other Topics Concern   Not on file  Social History Narrative   ** Merged History  Encounter **       Social Drivers of Health   Tobacco Use: Low Risk (05/06/2024)   Patient History    Smoking Tobacco Use: Never    Smokeless Tobacco Use: Never    Passive Exposure: Not on file  Financial Resource Strain: Low Risk  (05/09/2022)   Received from [facility]   Overall Financial Resource Strain (CARDIA)    Difficulty of Paying Living Expenses: Not hard at all  Food Insecurity: No Food Insecurity (05/09/2022)   Received from [facility]   Epic    Within the past 12 months, you worried that your food would run out before you got the money to buy more.: Never true    Within the past 12 months, the food you bought just didn't last and you didn't have money to get more.: Never true  Transportation Needs: Unknown (05/09/2022)   Received from [facility] - Transportation    In the past 12 months, has lack of transportation kept you from medical appointments or from getting medications?: No    Lack of Transportation (Non-Medical): Not on file  Physical Activity: Not on file  Stress: Not on file  Social Connections: Not on file  Depression (EYV7-0): Not on file  Alcohol Screen: Not on file  Housing: Unknown (07/22/2023)   Received  from [facility]   Epic    In the last 12 months, was there a time when you were not able to pay the mortgage or rent on time?: No    Number of Times Moved in the Last Year: Not on file    At any time in the past 12 months, were you homeless or living in a shelter (including now)?: No  Utilities: Not At Risk (05/09/2022)   Received from [facility]ities    Threatened with loss of utilities: No  Health Literacy: Not on file   Family History  Problem Relation Age of Onset   Breast cancer Neg Hx    Allergies[1] Prior to Admission medications  Medication Sig Start Date End Date Taking? Authorizing Provider  buPROPion (WELLBUTRIN) 75 MG  tablet Take by mouth. 12/12/17 12/12/18  [provider]  celecoxib (CELEBREX) 100 MG capsule Take 100 mg by mouth 2 (two) times daily.    [provider]  clonazePAM (KLONOPIN) 1 MG tablet TAKE ONE TABLET BY MOUTH TWO TIMES A DAY 02/29/12   [provider]  etodolac  (LODINE  XL) 600 MG 24 hr tablet TAKE ONE TABLET BY MOUTH DAILY 11/24/19   Thekkekandam, Thomas J, MD  Hylan (SYNVISC) 16 MG/2ML SOSY Inject 1 syringe into the knee weekly x3 05/12/21   Curtis Debby PARAS, MD  traMADol  (ULTRAM ) 50 MG tablet Take 1-2 tablets (50-100 mg total) by mouth every 8 (eight) hours as needed for moderate pain. Maximum 6 tabs per day. 05/02/19   Curtis Debby PARAS, MD  zolpidem (AMBIEN) 10 MG tablet TAKE ONE TABLET BY MOUTH EVERY NIGHT AT BEDTIME AS NEEDED FOR SLEEP 12/14/16   [provider]     Positive ROS: All other systems have been reviewed and were otherwise negative with the exception of those mentioned in the HPI and as above.  Physical Exam: General: Alert, no acute distress Cardiovascular: No pedal edema Respiratory: No cyanosis, no use of accessory musculature GI: No organomegaly, abdomen is soft and non-tender Skin: No lesions in the area of chief complaint Neurologic: Sensation intact distally Psychiatric: Patient is competent for consent with normal mood and affect Lymphatic: No axillary or cervical lymphadenopathy  MUSCULOSKELETAL: non-TTP, ROM limited to 5-90 degrees, NVI   Imaging: n/a   Assessment: ANKYLOSIS RIGHT KNEE  Plan: Plan for Procedures: MANIPULATION, JOINT, KNEE, WITH ANESTHESIA  The risks benefits and alternatives were discussed with the patient including but not limited to the risks of nonoperative treatment, versus surgical intervention including infection, bleeding, nerve injury,  blood clots, cardiopulmonary complications, morbidity, mortality, among others, and they were willing to proceed.   Weightbearing: WBAT  RLE Orthopedic devices: n/a Dressing: none Medicines: dose pack  Discharge: home Follow up: 05/23/24 at 11:45am with me    Gerard CHRISTELLA Ted DEVONNA Office [phone] 05/12/2024 4:02 PM        [1]  Allergies Allergen Reactions   Penicillins Nausea Only    @ Gms Ancef  IV with no obvious reaction

## 2024-05-06 ENCOUNTER — Encounter (HOSPITAL_BASED_OUTPATIENT_CLINIC_OR_DEPARTMENT_OTHER): Payer: Self-pay | Admitting: Orthopedic Surgery

## 2024-05-06 ENCOUNTER — Other Ambulatory Visit: Payer: Self-pay

## 2024-05-06 NOTE — Progress Notes (Signed)
" °   05/06/24 1559  PAT Phone Screen  Is the patient taking a GLP-1 receptor agonist? No  Do You Have Diabetes? No  Do You Have Hypertension? No  Have You Ever Been to the ER for Asthma? No  Have You Taken Oral Steroids in the Past 3 Months? No  Do you Take Phenteramine or any Other Diet Drugs? No  Recent  Lab Work, EKG, CXR? No  Do you have a history of heart problems? No  Any Recent Hospitalizations? No  Height 5' 5 (1.651 m)  Weight 59.9 kg  Pat Appointment Scheduled Yes (ERAS)    "

## 2024-05-13 ENCOUNTER — Ambulatory Visit (HOSPITAL_BASED_OUTPATIENT_CLINIC_OR_DEPARTMENT_OTHER): Admission: RE | Admit: 2024-05-13 | Source: Home / Self Care | Admitting: Orthopedic Surgery

## 2024-05-13 ENCOUNTER — Ambulatory Visit (HOSPITAL_BASED_OUTPATIENT_CLINIC_OR_DEPARTMENT_OTHER): Admitting: Certified Registered Nurse Anesthetist

## 2024-05-13 ENCOUNTER — Encounter (HOSPITAL_BASED_OUTPATIENT_CLINIC_OR_DEPARTMENT_OTHER)
Admission: RE | Disposition: A | Discharge status: Home / Self Care | Payer: Self-pay | Source: Home / Self Care | Attending: Orthopedic Surgery

## 2024-05-13 ENCOUNTER — Encounter (HOSPITAL_BASED_OUTPATIENT_CLINIC_OR_DEPARTMENT_OTHER): Payer: Self-pay | Admitting: Orthopedic Surgery

## 2024-05-13 ENCOUNTER — Other Ambulatory Visit: Payer: Self-pay

## 2024-05-13 DIAGNOSIS — M24661 Ankylosis, right knee: Secondary | ICD-10-CM | POA: Diagnosis present

## 2024-05-13 DIAGNOSIS — F419 Anxiety disorder, unspecified: Secondary | ICD-10-CM | POA: Insufficient documentation

## 2024-05-13 HISTORY — DX: Insomnia, unspecified: G47.00

## 2024-05-13 HISTORY — DX: Motion sickness, initial encounter: T75.3XXA

## 2024-05-13 MED ORDER — OXYCODONE HCL 5 MG PO TABS
ORAL_TABLET | ORAL | Status: AC
  Filled 2024-05-13: qty 1

## 2024-05-13 MED ORDER — ACETAMINOPHEN 500 MG PO TABS
ORAL_TABLET | ORAL | Status: AC
  Filled 2024-05-13: qty 2

## 2024-05-13 MED ORDER — OXYCODONE HCL 5 MG/5ML PO SOLN: 5.0000 mg | Freq: Once | ORAL | Status: AC | PRN

## 2024-05-13 MED ORDER — DROPERIDOL 2.5 MG/ML IJ SOLN: 0.6250 mg | Freq: Once | INTRAMUSCULAR | Status: DC | PRN

## 2024-05-13 MED ORDER — LACTATED RINGERS IV SOLN: INTRAVENOUS | Status: DC

## 2024-05-13 MED ORDER — FENTANYL CITRATE (PF) 100 MCG/2ML IJ SOLN
25.0000 ug | INTRAMUSCULAR | Status: DC | PRN
  Administered 2024-05-13: 50 ug via INTRAVENOUS

## 2024-05-13 MED ORDER — ONDANSETRON HCL 4 MG/2ML IJ SOLN
INTRAMUSCULAR | Status: DC | PRN
  Administered 2024-05-13: 4 mg via INTRAVENOUS

## 2024-05-13 MED ORDER — ONDANSETRON HCL 4 MG/2ML IJ SOLN: 4.0000 mg | Freq: Once | INTRAMUSCULAR | Status: DC | PRN

## 2024-05-13 MED ORDER — METHYLPREDNISOLONE 4 MG PO TBPK: ORAL_TABLET | ORAL | 0 refills | Status: AC

## 2024-05-13 MED ORDER — OXYCODONE HCL 5 MG PO TABS
5.0000 mg | ORAL_TABLET | Freq: Once | ORAL | Status: AC | PRN
  Administered 2024-05-13: 5 mg via ORAL

## 2024-05-13 MED ORDER — MIDAZOLAM HCL 2 MG/2ML IJ SOLN
INTRAMUSCULAR | Status: AC
  Filled 2024-05-13: qty 2

## 2024-05-13 MED ORDER — ACETAMINOPHEN 500 MG PO TABS
1000.0000 mg | ORAL_TABLET | Freq: Once | ORAL | Status: AC
  Administered 2024-05-13: 1000 mg via ORAL

## 2024-05-13 MED ORDER — DEXMEDETOMIDINE HCL IN NACL 80 MCG/20ML IV SOLN
INTRAVENOUS | Status: DC | PRN
  Administered 2024-05-13: 4 ug via INTRAVENOUS

## 2024-05-13 MED ORDER — POVIDONE-IODINE 10 % EX SWAB
2.0000 | Freq: Once | CUTANEOUS | Status: AC
  Administered 2024-05-13: 2 via TOPICAL

## 2024-05-13 MED ORDER — DEXAMETHASONE SOD PHOSPHATE PF 10 MG/ML IJ SOLN
8.0000 mg | Freq: Once | INTRAMUSCULAR | Status: AC
  Administered 2024-05-13: 8 mg via INTRAVENOUS

## 2024-05-13 MED ORDER — LIDOCAINE 2% (20 MG/ML) 5 ML SYRINGE
INTRAMUSCULAR | Status: DC | PRN
  Administered 2024-05-13: 60 mg via INTRAVENOUS

## 2024-05-13 MED ORDER — FENTANYL CITRATE (PF) 100 MCG/2ML IJ SOLN
INTRAMUSCULAR | Status: AC
  Filled 2024-05-13: qty 2

## 2024-05-13 MED ORDER — DEXMEDETOMIDINE HCL IN NACL 80 MCG/20ML IV SOLN
INTRAVENOUS | Status: AC
  Filled 2024-05-13: qty 40

## 2024-05-13 NOTE — Anesthesia Preprocedure Evaluation (Signed)
"                                    Anesthesia Evaluation    Airway Mallampati: II       Dental no notable dental hx. (+) Teeth Intact, Dental Advisory Given, Caps   Pulmonary asthma    Pulmonary exam normal breath sounds clear to auscultation       Cardiovascular negative cardio ROS Normal cardiovascular exam Rhythm:Regular Rate:Normal     Neuro/Psych   Anxiety     negative neurological ROS     GI/Hepatic negative GI ROS, Neg liver ROS,,,  Endo/Other  negative endocrine ROS    Renal/GU negative Renal ROS  negative genitourinary   Musculoskeletal  (+) Arthritis , Osteoarthritis,  Anklosis right knee   Abdominal   Peds  Hematology negative hematology ROS (+)   Anesthesia Other Findings   Reproductive/Obstetrics                              Anesthesia Physical Anesthesia Plan  ASA: 2  Anesthesia Plan: General   Post-op Pain Management: Dilaudid  IV, Minimal or no pain anticipated, Tylenol  PO (pre-op)* and Precedex    Induction:   PONV Risk Score and Plan: 4 or greater and Treatment may vary due to age or medical condition, Midazolam , Ondansetron  and Dexamethasone   Airway Management Planned: LMA  Additional Equipment: None  Intra-op Plan:   Post-operative Plan: Extubation in OR  Informed Consent: I have reviewed the patients History and Physical, chart, labs and discussed the procedure including the risks, benefits and alternatives for the proposed anesthesia with the patient or authorized representative who has indicated his/her understanding and acceptance.     Dental advisory given  Plan Discussed with: CRNA and Anesthesiologist  Anesthesia Plan Comments:          Anesthesia Quick Evaluation  "

## 2024-05-13 NOTE — Interval H&P Note (Signed)
 History and Physical Interval Note:  05/13/2024 7:27 AM  Vanessa Moss  has presented today for surgery, with the diagnosis of ANKYLOSIS RIGHT KNEE.  The various methods of treatment have been discussed with the patient and family. After consideration of risks, benefits and other options for treatment, the patient has consented to  Procedures: MANIPULATION, JOINT, KNEE, WITH ANESTHESIA (Right) as a surgical intervention.  The patient's history has been reviewed, patient examined, no change in status, stable for surgery.  I have reviewed the patient's chart and labs.  Questions were answered to the patient's satisfaction.     [name]

## 2024-05-13 NOTE — Op Note (Signed)
 05/13/2024  10:47 AM  PATIENT:  Vanessa Moss    PRE-OPERATIVE DIAGNOSIS:  ANKYLOSIS RIGHT KNEE  POST-OPERATIVE DIAGNOSIS:  Same  PROCEDURE:  MANIPULATION, JOINT, KNEE, WITH ANESTHESIA  SURGEON:  [name], MD  ASSISTANT: Gerard Large, PA-C, was present and scrubbed throughout the case, critical for completion in a timely fashion, and for retraction, instrumentation, and closure.   ANESTHESIA:   see chart  PREOPERATIVE INDICATIONS:  Vanessa Moss is a  53 y.o. female with a diagnosis of ANKYLOSIS RIGHT KNEE who failed conservative measures and elected for surgical management.    The risks benefits and alternatives were discussed with the patient preoperatively including but not limited to the risks of infection, bleeding, nerve injury, cardiopulmonary complications, the need for revision surgery, among others, and the patient was willing to proceed.  OPERATIVE IMPLANTS: None  OPERATIVE FINDINGS: Range of motion improved from 2 to 90 degrees prior to manipulation to 2-125 degrees after manipulation  BLOOD LOSS: see record  TOURNIQUET TIME: See record  OPERATIVE PROCEDURE:  Patient was identified in the preoperative holding area and site was marked by me She was transported to the operating theater and placed on the table in supine position taking care to pad all bony prominences. After a preincinduction time out anesthesia was induced.   I made a firm with gentle manipulation of the right knee and was happy with the improved motion noted above  Xrays: these views were taken and reviewed by me.  None  POST OPERATIVE PLAN: Aggressive physical therapy

## 2024-05-13 NOTE — Interval H&P Note (Signed)
 History and Physical Interval Note:  05/13/2024 9:13 AM  Vanessa Moss  has presented today for surgery, with the diagnosis of ANKYLOSIS RIGHT KNEE.  The various methods of treatment have been discussed with the patient and family. After consideration of risks, benefits and other options for treatment, the patient has consented to  Procedures: MANIPULATION, JOINT, KNEE, WITH ANESTHESIA (Right) as a surgical intervention.  The patient's history has been reviewed, patient examined, no change in status, stable for surgery.  I have reviewed the patient's chart and labs.  Questions were answered to the patient's satisfaction.     [name]

## 2024-05-13 NOTE — Interval H&P Note (Signed)
 History and Physical Interval Note:  05/13/2024 7:14 AM  Vanessa Moss  has presented today for surgery, with the diagnosis of ANKYLOSIS RIGHT KNEE.  The various methods of treatment have been discussed with the patient and family. After consideration of risks, benefits and other options for treatment, the patient has consented to  Procedures: MANIPULATION, JOINT, KNEE, WITH ANESTHESIA (Right) as a surgical intervention.  The patient's history has been reviewed, patient examined, no change in status, stable for surgery.  I have reviewed the patient's chart and labs.  Questions were answered to the patient's satisfaction.     [name]

## 2024-05-13 NOTE — Transfer of Care (Signed)
 Immediate Anesthesia Transfer of Care Note  Patient: Vanessa Moss  Procedure(s) Performed: MANIPULATION, JOINT, KNEE, WITH ANESTHESIA (Right: Knee)  Patient Location: PACU  Anesthesia Type:General  Level of Consciousness: awake, alert , oriented, and patient cooperative  Airway & Oxygen Therapy: Patient Spontanous Breathing and Patient connected to face mask oxygen  Post-op Assessment: Report given to RN and Post -op Vital signs reviewed and stable  Post vital signs: Reviewed and stable  Last Vitals:  Vitals Value Taken Time  BP 104/71 05/13/24 09:54  Temp    Pulse 61 05/13/24 09:55  Resp 13 05/13/24 09:55  SpO2 99 % 05/13/24 09:55  Vitals shown include unfiled device data.  Last Pain:  Vitals:   05/13/24 0721  TempSrc: Temporal  PainSc: 5       Patients Stated Pain Goal: 5 (05/13/24 0721)  Complications: No notable events documented.

## 2024-05-13 NOTE — Anesthesia Postprocedure Evaluation (Signed)
"   Anesthesia Post Note  Patient: Vanessa Moss  Procedure(s) Performed: MANIPULATION, JOINT, KNEE, WITH ANESTHESIA (Right: Knee)     Patient location during evaluation: PACU Anesthesia Type: General Level of consciousness: awake and alert and oriented Pain management: pain level controlled Vital Signs Assessment: post-procedure vital signs reviewed and stable Respiratory status: spontaneous breathing, nonlabored ventilation and respiratory function stable Cardiovascular status: blood pressure returned to baseline and stable Postop Assessment: no apparent nausea or vomiting Anesthetic complications: no   No notable events documented.  Last Vitals:  Vitals:   05/13/24 1015 05/13/24 1023  BP: (P) 105/65 107/67  Pulse: 61 70  Resp: 15 18  Temp:  36.6 C  SpO2: 97% 94%    Last Pain:  Vitals:   05/13/24 1026  TempSrc:   PainSc: 3                  Keyion Knack A.      "

## 2024-05-13 NOTE — Discharge Instructions (Signed)
No tylenol until 1:30pm ? ? ? ?Post Anesthesia Home Care Instructions ? ?Activity: ?Get plenty of rest for the remainder of the day. A responsible individual must stay with you for 24 hours following the procedure.  ?For the next 24 hours, DO NOT: ?-Drive a car ?-Paediatric nurse ?-Drink alcoholic beverages ?-Take any medication unless instructed by your physician ?-Make any legal decisions or sign important papers. ? ?Meals: ?Start with liquid foods such as gelatin or soup. Progress to regular foods as tolerated. Avoid greasy, spicy, heavy foods. If nausea and/or vomiting occur, drink only clear liquids until the nausea and/or vomiting subsides. Call your physician if vomiting continues. ? ?Special Instructions/Symptoms: ?Your throat may feel dry or sore from the anesthesia or the breathing tube placed in your throat during surgery. If this causes discomfort, gargle with warm salt water. The discomfort should disappear within 24 hours. ? ?If you had a scopolamine patch placed behind your ear for the management of post- operative nausea and/or vomiting: ? ?1. The medication in the patch is effective for 72 hours, after which it should be removed.  Wrap patch in a tissue and discard in the trash. Wash hands thoroughly with soap and water. ?2. You may remove the patch earlier than 72 hours if you experience unpleasant side effects which may include dry mouth, dizziness or visual disturbances. ?3. Avoid touching the patch. Wash your hands with soap and water after contact with the patch. ?    ?

## 2024-05-13 NOTE — Anesthesia Procedure Notes (Signed)
 Procedure Name: LMA Insertion Date/Time: 05/13/2024 9:37 AM  Performed by: Claudene Delon SQUIBB, CRNAPre-anesthesia Checklist: Patient identified, Emergency Drugs available, Suction available and Patient being monitored Patient Re-evaluated:Patient Re-evaluated prior to induction Oxygen Delivery Method: Circle System Utilized Preoxygenation: Pre-oxygenation with 100% oxygen Induction Type: IV induction Ventilation: Mask ventilation without difficulty LMA: LMA inserted LMA Size: 4.0 Number of attempts: 1 Airway Equipment and Method: Bite block Placement Confirmation: positive ETCO2 Tube secured with: Tape Dental Injury: Teeth and Oropharynx as per pre-operative assessment

## 2024-05-14 ENCOUNTER — Encounter (HOSPITAL_BASED_OUTPATIENT_CLINIC_OR_DEPARTMENT_OTHER): Payer: Self-pay | Admitting: Orthopedic Surgery
